# Patient Record
Sex: Male | Born: 1940 | Race: Black or African American | Hispanic: No | Marital: Single | Smoking: Current every day smoker
Health system: Southern US, Community
[De-identification: ages and names within clinical notes are randomized; demographics above are authoritative.]

---

## 1999-11-08 ENCOUNTER — Inpatient Hospital Stay (HOSPITAL_COMMUNITY): Admission: EM | Admit: 1999-11-08 | Discharge: 1999-11-11 | Payer: Self-pay | Admitting: Neurosurgery

## 1999-11-27 ENCOUNTER — Encounter: Payer: Self-pay | Admitting: Neurosurgery

## 1999-11-27 ENCOUNTER — Encounter: Admission: RE | Admit: 1999-11-27 | Discharge: 1999-11-27 | Payer: Self-pay | Admitting: Neurosurgery

## 1999-12-02 ENCOUNTER — Inpatient Hospital Stay (HOSPITAL_COMMUNITY): Admission: RE | Admit: 1999-12-02 | Discharge: 1999-12-04 | Payer: Self-pay | Admitting: Neurosurgery

## 1999-12-02 ENCOUNTER — Encounter: Payer: Self-pay | Admitting: Neurosurgery

## 2002-07-07 ENCOUNTER — Encounter: Payer: Self-pay | Admitting: General Surgery

## 2002-07-07 ENCOUNTER — Ambulatory Visit (HOSPITAL_COMMUNITY): Admission: RE | Admit: 2002-07-07 | Discharge: 2002-07-07 | Payer: Self-pay | Admitting: General Surgery

## 2002-07-11 ENCOUNTER — Encounter (HOSPITAL_COMMUNITY): Admission: RE | Admit: 2002-07-11 | Discharge: 2002-08-10 | Payer: Self-pay | Admitting: General Surgery

## 2002-07-11 ENCOUNTER — Encounter: Payer: Self-pay | Admitting: General Surgery

## 2008-06-27 ENCOUNTER — Ambulatory Visit: Payer: Self-pay | Admitting: Internal Medicine

## 2008-07-18 ENCOUNTER — Encounter: Payer: Self-pay | Admitting: Internal Medicine

## 2008-07-18 ENCOUNTER — Ambulatory Visit (HOSPITAL_COMMUNITY): Admission: RE | Admit: 2008-07-18 | Discharge: 2008-07-18 | Payer: Self-pay | Admitting: Internal Medicine

## 2008-07-18 ENCOUNTER — Ambulatory Visit: Payer: Self-pay | Admitting: Internal Medicine

## 2008-08-15 ENCOUNTER — Encounter (INDEPENDENT_AMBULATORY_CARE_PROVIDER_SITE_OTHER): Payer: Self-pay | Admitting: *Deleted

## 2008-08-15 DIAGNOSIS — Z8719 Personal history of other diseases of the digestive system: Secondary | ICD-10-CM | POA: Insufficient documentation

## 2008-08-15 DIAGNOSIS — A630 Anogenital (venereal) warts: Secondary | ICD-10-CM

## 2010-10-14 NOTE — Consult Note (Signed)
Hayden Thompson, Hayden Thompson             ACCOUNT NO.:  [id number]   MEDICAL RECORD NO.:  [id number]         PATIENT TYPE:  AMB   LOCATION:  DAY                           FACILITY:  [facility]   PHYSICIAN:  R. Garfield Cornea, M.D. DATE OF BIRTH:  08-03-1940   DATE OF CONSULTATION:  06/27/2008  DATE OF DISCHARGE:                                 CONSULTATION   REFERRING PHYSICIAN:  Tesfaye D. Legrand Rams, MD   REASON FOR CONSULTATION:  History of Crohn's disease, need for screening  colonoscopy.   HISTORY OF PRESENT ILLNESS:  Hayden Thompson is a very pleasant 59-  year-old [place] American gentleman seen at the request of Dr. Legrand Rams for  consideration of colorectal cancer screening.  He has a history of what  sounds like small bowel Crohn's disease and underwent 2 laparotomies  back in 1975.  At that time, he describes postprandial nausea and  vomiting.  Dr. Loanne Drilling, a surgeon here, performed a laparotomy and,  according to the patient, did not know what to do with it and closed  him back up, and sent him down to [facility] where he had another laparotomy  and has what sounds like a partial small bowel resection.  Since that  time, he has had relief of those symptoms, has 2 to 3 bowel movements  daily, has not really had any abdominal pain, nausea or vomiting and  apparently has thrived very well.  Will add further  evaluation/intervention he is on no Crohn's medications.  He denies ever  having any rectal bleeding or melena.  He denies odynophagia, dysphagia,  early satiety, reflux symptoms or nausea or vomiting currently.  His  weight has been stable.  He has never had his lower GI or rectal  evaluated via colonoscopy.  There is no family history of polyps or  colorectal neoplasia.   PAST MEDICAL HISTORY:  1. 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  2. Hypertension.  3. History of cerebral aneurysm status post clipping.   PAST SURGERIES:  1. Laparotomy x2 for Crohn's in 1975.  2. Craniotomy for  aneurysm clipping.   CURRENT MEDICATIONS:  1. Lisinopril/HCTZ 2.20/12.5 mg daily.  2. Amlodipine 10 mg daily.  3. Aspirin p.r.n.   ALLERGIES:  No known drug allergies.   FAMILY HISTORY:  Negative for chronic GI or liver illness.   SOCIAL HISTORY:  Patient is single.  He has 2 children.  He is retired  from [employer].  He smokes 1 pack of cigarettes per day.  He rarely consumes alcoholic beverage.   REVIEW OF SYSTEMS:  No chest pain, no dyspnea on exertion, no change in  weight, no fever, chills, no night sweats.  Otherwise, as in history of  present illness.   PHYSICAL EXAMINATION:  Pleasant 70 year old gentleman resting  comfortably.  Weight 144, height 5 foot 7, temp 98.4, BP 130/88, pulse  60.  SKIN:  Warm and dry.  HEENT:  No scleral icterus.  Conjunctivae are pink.  CHEST:  Lungs are clear to auscultation.  CORONARY:  Regular rate and rhythm without murmur, gallop, or rub.  ABDOMEN:  Nondistended.  He  has a  midline vertical laparotomy scars,  some cheloid formation.  Positive bowel sounds.  Soft, entirely  nontender, without appreciable mass or hepatomegaly.  EXTREMITIES:  No edema.  RECTAL:  Deferred to the time of colonoscopy.   IMPRESSION:  Hayden Thompson is a very pleasant 70 year old  gentleman, virtually asymptomatic from a gastrointestinal standpoint.  He gives a history of a laparotomy for Crohn's disease back in 1975.  It  appears he does have Crohn's disease.  He has enjoyed a long-term  surgical remission for some 35 years.  He has never had his lower  gastrointestinal tract evaluated.  I agree with Dr. Legrand Rams the most  pressing issue now is a screening colonoscopy.  Risks, benefits,  alternatives and limitations of the procedure have been reviewed with  Hayden Thompson.  Hopefully, I will be able to get up into the terminal ileum  and assess him for Crohn's disease activity.  His questions were  answered.  He is agreeable.  We will plan to  perform a colonoscopy in  the very near future.  Further recommendations to follow.   Thank you, Dr. Legrand Rams, for allowing me to see this very nice gentleman  today.      Bridgette Habermann, M.D.  Electronically Signed     RMR/MEDQ  D:  06/27/2008  T:  06/27/2008  Job:  [id number]   cc:   Tesfaye D. Legrand Rams, MD  Fax: [phone]

## 2010-10-14 NOTE — Op Note (Signed)
NAMESHARON, BENTE             ACCOUNT NO.:  [id number]   MEDICAL RECORD NO.:  [id number]          PATIENT TYPE:  AMB   LOCATION:  DAY                           FACILITY:  [facility]   PHYSICIAN:  R. Garfield Cornea, M.D. DATE OF BIRTH:  1941-03-01   DATE OF PROCEDURE:  07/18/2008  DATE OF DISCHARGE:                               OPERATIVE REPORT   Ileal colonoscopy with biopsy.   INDICATIONS FOR PROCEDURE:  A 70 year old gentleman with no GI symptoms  currently gives a history of laparotomy x2 for Crohn disease back in the  mid 1970s.  He again, not having any GI symptoms currently, not on any  medications for Crohn's.  He has never had his lower GI tract evaluated.  There is no family history of colorectal neoplasia.  Colonoscopy is now  being done as a standard screening maneuver.  The risks, benefits,  alternatives and limitations have been reviewed and questions answered.  Please see the documentation in the medical record.   PROCEDURE NOTE:  O2 saturation, blood pressure, pulse, respirations were  monitored throughout the entire procedure.   CONSCIOUS SEDATION:  Versed 3 mg IV and Demerol 50 mg IV in divided  doses.   INSTRUMENT:  Pentax video chip system.   FINDINGS:  Digital rectal exam was performed.  Endoscopic findings of  the prep was marginal to poor.  Colon:  The colonic mucosa was surveyed  from rectosigmoid junction through the left transverse and right colon  to the appendiceal orifice, ileocecal valve and cecum.  These structures  were well seen and photographed for the record.  He had an intact  ileocecal valve and cecum and appendiceal orifice.  The terminal ileum  was intubated to 5 cm and from this level the scope was slowly  withdrawn.  All previous mentioned mucosal surfaces were again seen.  The prep was poor.  There was tenacious coating of stool throughout the  colon with relative sparing of the area of the cecum and ileocecal  valve.  I could not wash  all the stool away to get adequate  visualization of all the finer mucosal detail of the colon.  There were  no gross colonic mucosal abnormalities.  Please note that his terminal  ileum also appeared entirely normal.  The scope was pulled down in the  rectum where again the prep was poor.  Examination of the rectal mucosa  including retroflexion was carried out.  At the anal verge was a 1-1.5  cm hard, fibrotic mass with a polypoid surface.  Please see multiple  photographs.  This lesion was hard to biopsy palpation.  It was biopsied  multiple times with minimal bleeding.  The patient tolerated the  procedure well and was reactive after endoscopy.   IMPRESSION:  A 1-1.5 cm polypoid hard, fibrotic mass at the anal verge  status post biopsy.  Rectal mucosa not well seen along with the  remainder of the colonic mucosa due to a poor preparation.  No gross  rectal or colonic lesions, otherwise.  Intact ileocecal valve,  appendiceal orifice and terminal ileum which in my mind  brings in the  question of the diagnosis of Crohn disease.  I suppose he could have  Crohn disease and in fact has enjoyed a remarkable remission.   As far as colon cancer screening goes, I would bring this gentleman back  early i.e., in 5 years given the relatively poor prep today.  He would  need and extended prep at this time.  However, depending on the  pathology on the nodule removed from his rectum plans may change.      Bridgette Habermann, M.D.  Electronically Signed     RMR/MEDQ  D:  07/18/2008  T:  07/18/2008  Job:  [id number]   cc:   Tesfaye D. Legrand Rams, MD  Fax: [phone]

## 2010-10-17 NOTE — Op Note (Signed)
[facility]  Patient:    Hayden Thompson, Hayden Thompson                      MRN: [id number] Proc. Date: 12/01/99 Adm. Date:  [id number] Attending:  Abran Duke                           Operative Report  PREOPERATIVE DIAGNOSIS:  Left subacute subdural hematoma.  POSTOPERATIVE DIAGNOSIS:  Left subacute subdural hematoma.  OPERATION:  Bur hole drainage of left subacute subdural hematoma.  SURGEON:  Earnie Larsson, M.D.  ASSISTANT:  None.  ANESTHESIA:  General endotracheal.  INDICATIONS FOR PROCEDURE:  [name] is a 70 year old black male with history of head trauma approximately four weeks ago.  Patient has progressively worsening headaches.  CT scanning demonstrates an enlarging subacute left convexity subdural hematoma.  There is significant mass affect. Patient presents now for bur hole drainage.  DESCRIPTION OF PROCEDURE:  Patient taken to the operating and placed on the table in the supine position after general anesthesia achieved.  Patient positioned supine with his neck turned to the right.  The patients frontotemporal parietal scalp was shaved and prepped sterilely on the left side.  A 10 blade was used to make a linear skin incision in the left frontal and left parietal region.  Self-retaining retractor was placed.  The high- speed drill was then used to make bur holes in the left frontal and left parietal region.  The dura was then coagulated and incised in the cruciate fashion.  Subdural fluid was allowed to drain under pressure.  The subdural compartment was washed out. A 10 mm flat Blake drain was then left in the subdural space.  It was passed from the posterior burr hole to the anterior burr hole under direct visualization.  This was then exited through a separate stab incision.  Hemostasis was excellent.  There was no evidence of active bleeding.  Wound is then closed in layers with Vicryl sutures.  Staples applied to the surface.   There were no intraoperative complications.  Patient tolerated procedure well and returns to the recovery room postoperatively. DD:  12/01/99 TD:  12/02/99 Job: [id number]

## 2010-10-17 NOTE — H&P (Signed)
[facility]  Patient:    Hayden Thompson, Hayden Thompson                      MRN: [id number] Adm. Date:  11/08/99 Attending:  Earnie Larsson, M.D.                         History and Physical  ADMISSION DIAGNOSES: 1. Bilateral subacute hematoma. 2. History of severe alcohol abuse. 3. History of cocaine abuse. 4. Remote history of chest pain.  HISTORY OF PRESENT ILLNESS:  Hayden Thompson is a 70 year old black male with history of alcohol abuse and cocaine abuse.  The patient states he has been trying to detoxify. He states he has been free of any alcohol or cocaine over the past four weeks.  The patient reports that two to three days ago, he suffered a fall secondary to some of the medications he has been taking.  He struck his chest on the refrigerator and was having intermittent chest pain. He had an episode of chest pain this evening and presented to the [facility] Room for evaluation of the chest pain. At that time, the patient also complained of headaches.  The patients chest pain resolved.  Cardiac workup at [facility] was negative.  Enzymes were negative.  EKG was negative for any acute changes and the patient was completely free of any further chest pain.  They did proceed with a head CT scan which demonstrated small bilateral subacute subdural hematomas.  The left is somewhat greater than the right. The left one is just under 9 mm in width.  There is minimal if any mass effects associated with this.  There is some acute blood mixed in with the chronic blood.  The patient has no other neurological complaints other than headache.  He has no motor or sensory complaints.  He has no cranial nerve dysfunction.  He says cognitively he has been doing well.  PAST MEDICAL HISTORY:  Notable for a remote history of Crohns disease.  The patient takes no medicines for this.  He has a history of alcohol abuse and drug abuse as noted above.  CURRENT MEDICATIONS:   Unavailable. The patient states he has been taking some nerve pills.  ALLERGIES:  None.  FAMILY HISTORY:  Noncontributory.  SOCIAL HISTORY:  The patient is employed by the [employer].  He is not married.  He smokes tobacco reasonably heavily.  REVIEW OF SYSTEMS:  Negative for any ongoing cardiac, respiratory, genitourinary, or musculoskeletal complaints.  PHYSICAL EXAMINATION:  NEUROLOGIC:  He is very pleasant and cooperative.  He is a medium built black male who is holding his head.  He is awake, alert, oriented and appropriate. His speech is fluent.  His judgment and insight are intact.  Visual acuity and visual fields are normal bilaterally.  Extraocular movements are full.  Facial movement and sensation are normal bilaterally. His hearing is intact bilaterally. His tongue protrudes in the midline bilaterally.  His shoulders shrug equally.  His palate elevates in the midline.  His tongue protrudes in the midline.  Motor and sensory examination of the extremities is completely normal.  He has no evidence of pronator drift.  Deep tendon reflexes are normoactive.  HEENT:  Reveals him to be atraumatic and normocephalic. There is no evidence of any bony abnormalities.  His oropharynx and nasopharynx and external auditory canals are all clear.  NECK:  Supple with a full  active range of motion.  CHEST: Clear to auscultation.  HEART:  Regular rate and rhythm.  ABDOMEN:  Benign.  EXTREMITIES:  Free from injury or deformity.  GU/RECTAL:  Deferred.  I reviewed this patients CT scan of his brain.  This does demonstrate small bilateral subacute subdural hematoma.  Left is somewhat larger than the right. Once again, there is some mild mass effects but no significant indications for proceeding with surgery.  IMPRESSION:  Bilateral subacute subdural hematoma with minimal mass effect.  PLAN:  Admission for observation.  I will treat him with IV steroids and hopefully  this in time will allow his symptoms to dissipate.  I do not think the fluid collections will be require drainage at this point. Should he worsen or even not improve than I would consider operative intervention. DD:  11/08/99 TD:  11/09/99 Job: [id number]

## 2013-10-27 ENCOUNTER — Other Ambulatory Visit (HOSPITAL_COMMUNITY): Payer: Self-pay | Admitting: Internal Medicine

## 2013-10-27 DIAGNOSIS — N19 Unspecified kidney failure: Secondary | ICD-10-CM

## 2013-11-01 ENCOUNTER — Ambulatory Visit (HOSPITAL_COMMUNITY)
Admission: RE | Admit: 2013-11-01 | Discharge: 2013-11-01 | Disposition: A | Discharge status: Home / Self Care | Payer: Medicare Other | Source: Ambulatory Visit | Attending: Internal Medicine | Admitting: Internal Medicine

## 2013-11-01 DIAGNOSIS — N19 Unspecified kidney failure: Secondary | ICD-10-CM | POA: Insufficient documentation

## 2013-11-01 DIAGNOSIS — Q619 Cystic kidney disease, unspecified: Secondary | ICD-10-CM | POA: Insufficient documentation

## 2014-07-12 DIAGNOSIS — K5090 Crohn's disease, unspecified, without complications: Secondary | ICD-10-CM | POA: Diagnosis not present

## 2014-07-12 DIAGNOSIS — I10 Essential (primary) hypertension: Secondary | ICD-10-CM | POA: Diagnosis not present

## 2014-07-12 DIAGNOSIS — F17290 Nicotine dependence, other tobacco product, uncomplicated: Secondary | ICD-10-CM | POA: Diagnosis not present

## 2014-09-06 IMAGING — US US RENAL
1 series · 14 of 25 positions shown · non-contrast
Comparison: None.

CLINICAL DATA: Renal failure

EXAM:
RENAL/URINARY TRACT ULTRASOUND COMPLETE

[Series 1: us renal · 0.18mm/px · 14 of 67 slices shown]
[im 1/67]
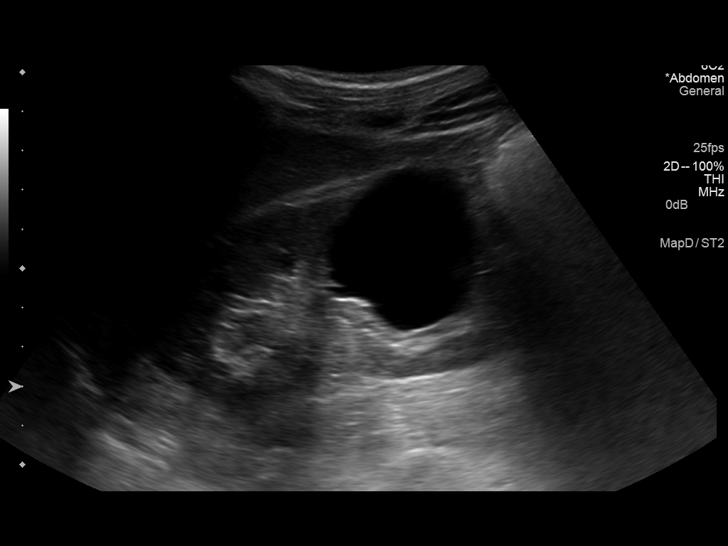
[im 6/67]
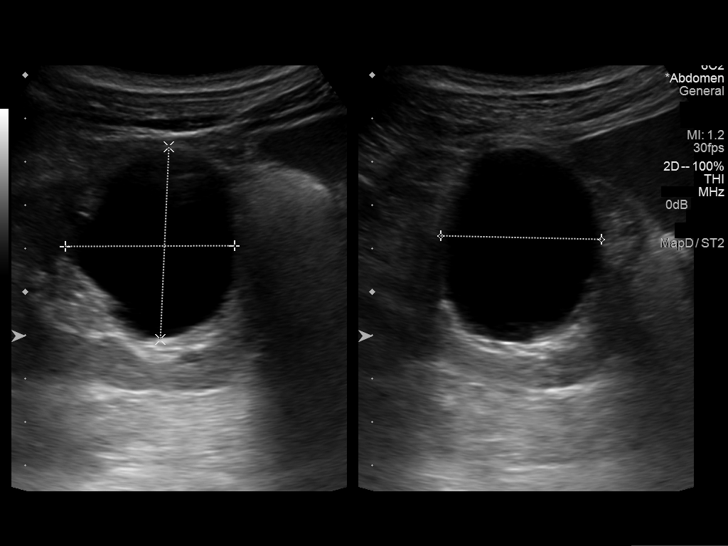
[im 12/67]
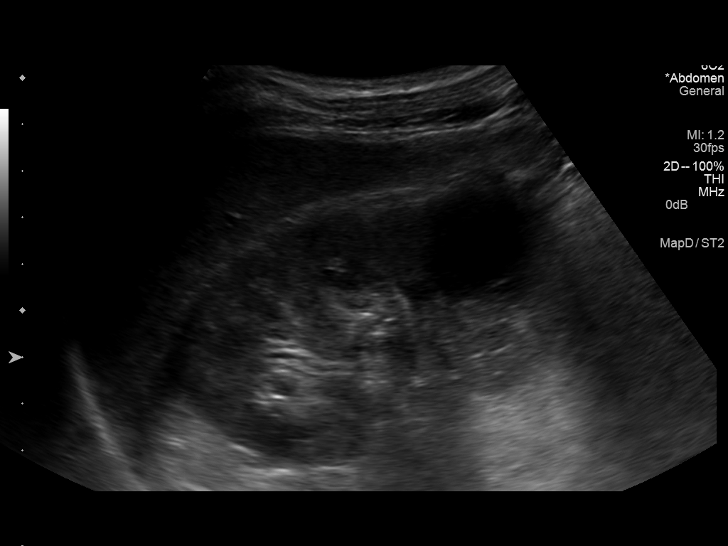
[im 17/67]
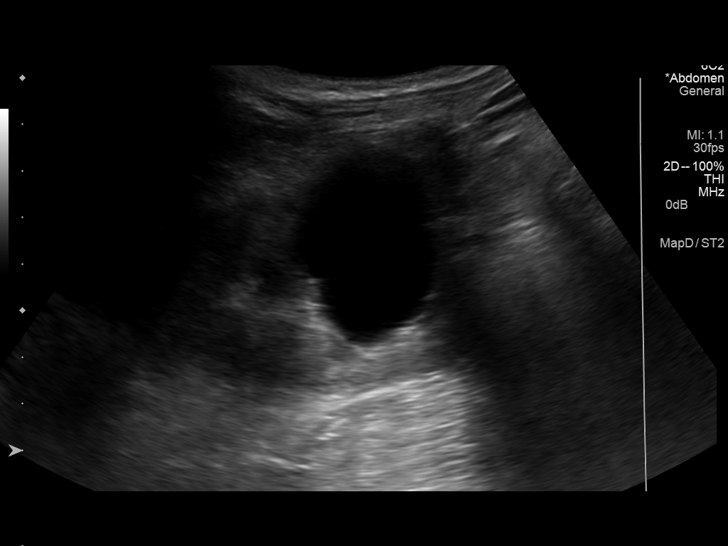
[im 23/67]
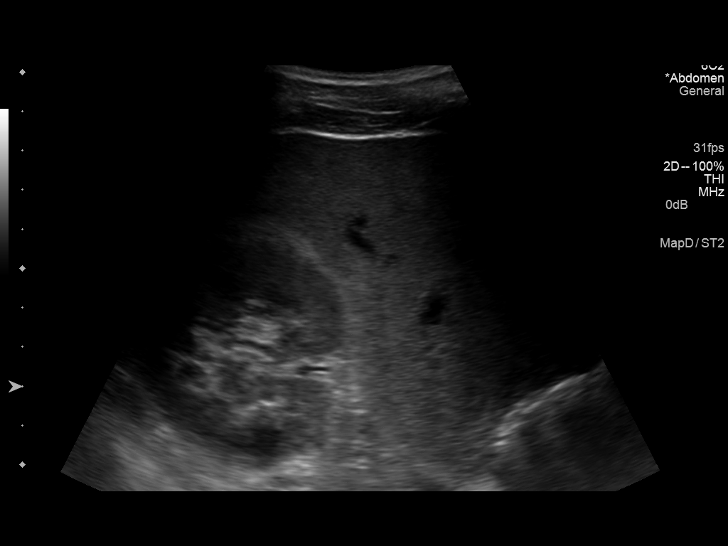
[im 25/67]
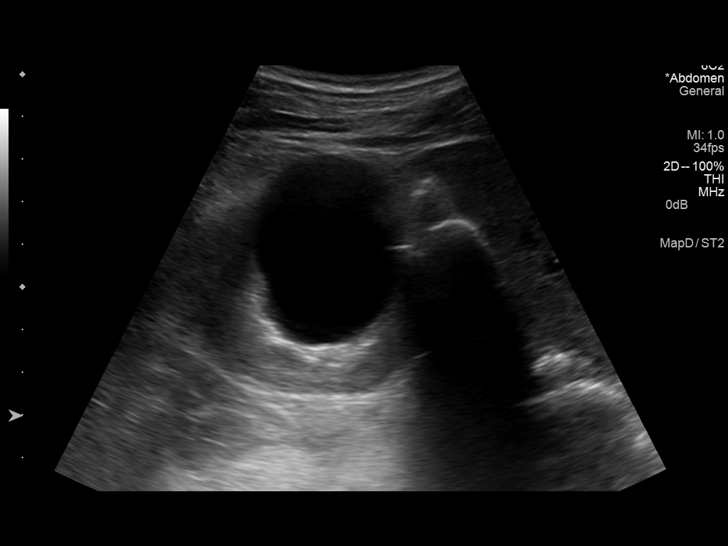
[im 31/67]
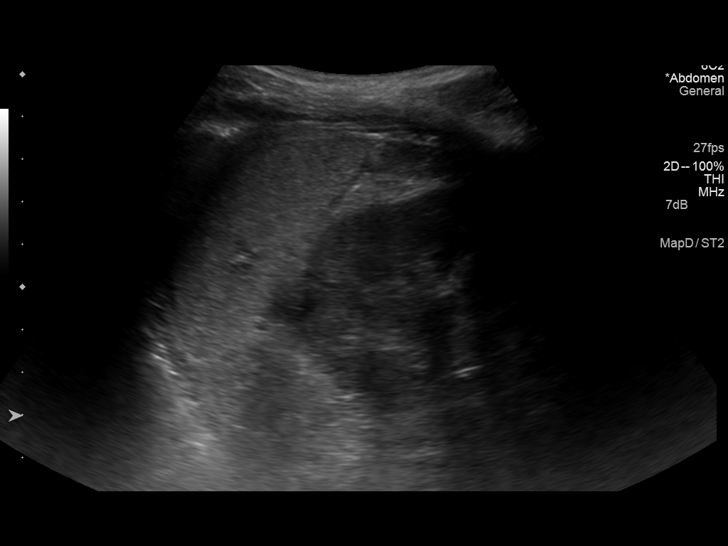
[im 36/67]
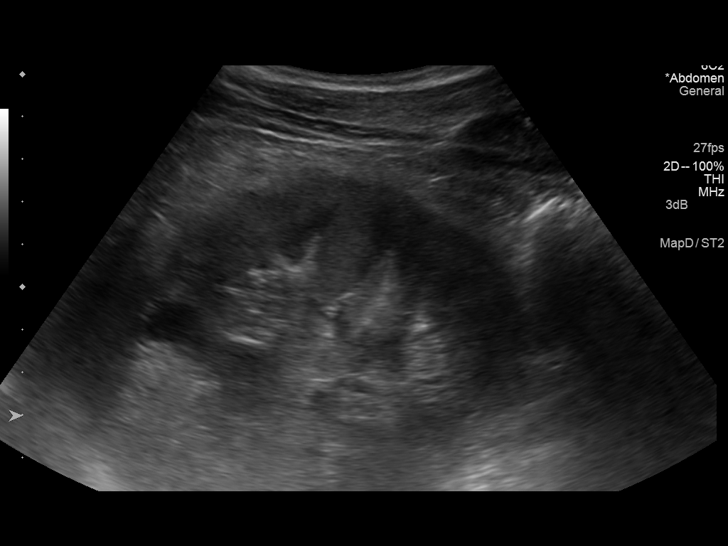
[im 42/67]
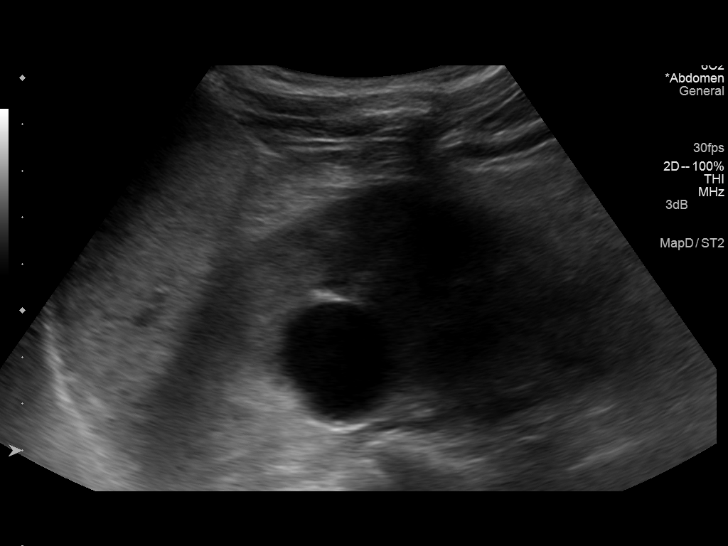
[im 45/67]
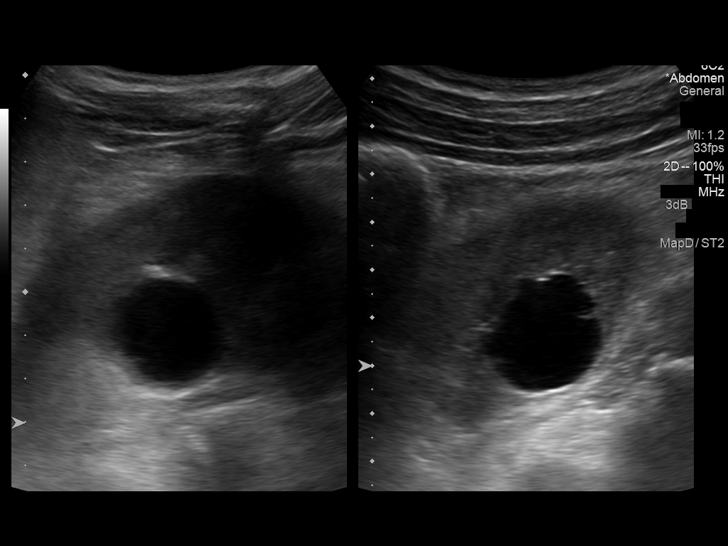
[im 50/67]
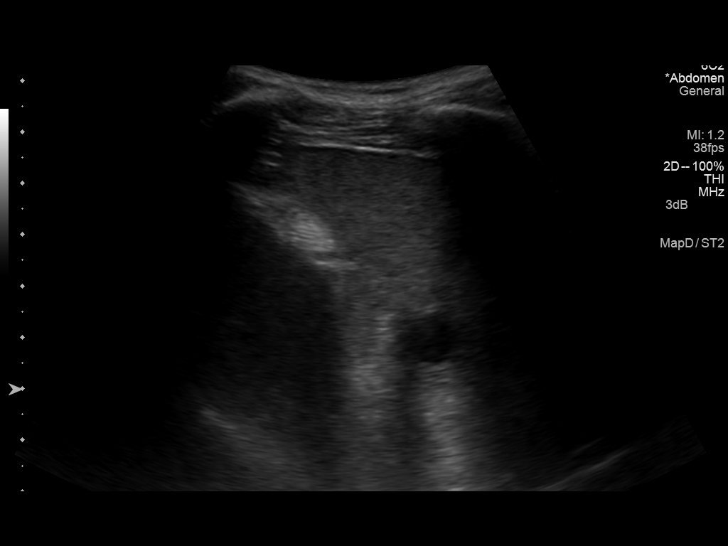
[im 56/67]
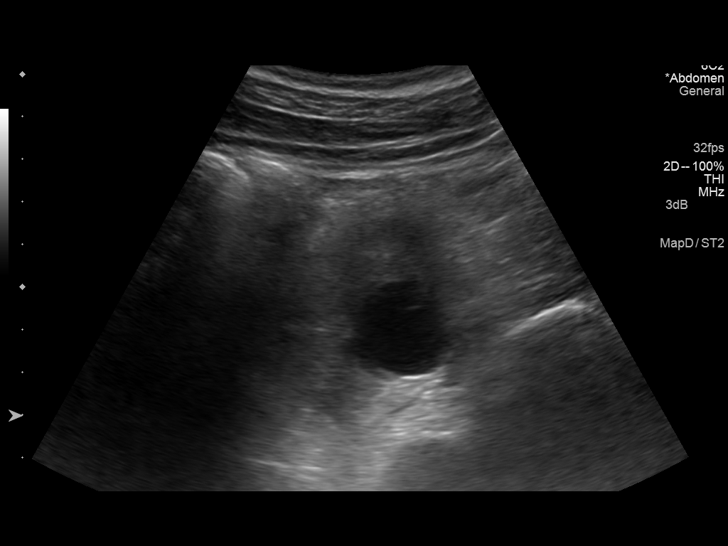
[im 61/67]
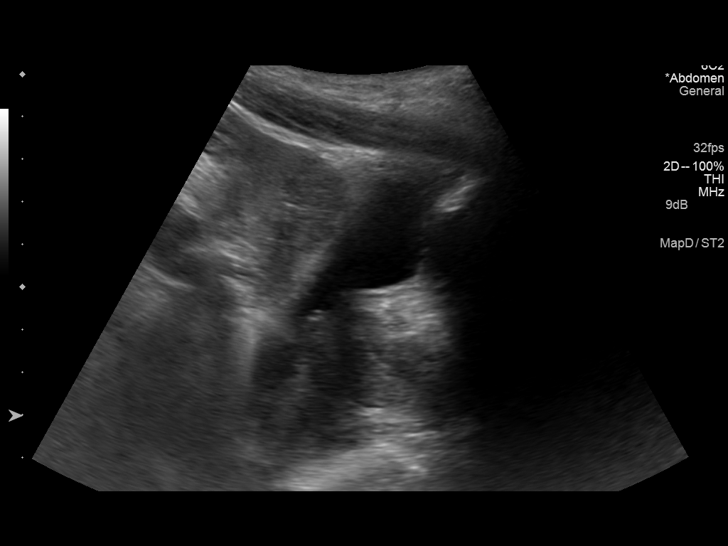
[im 67/67]
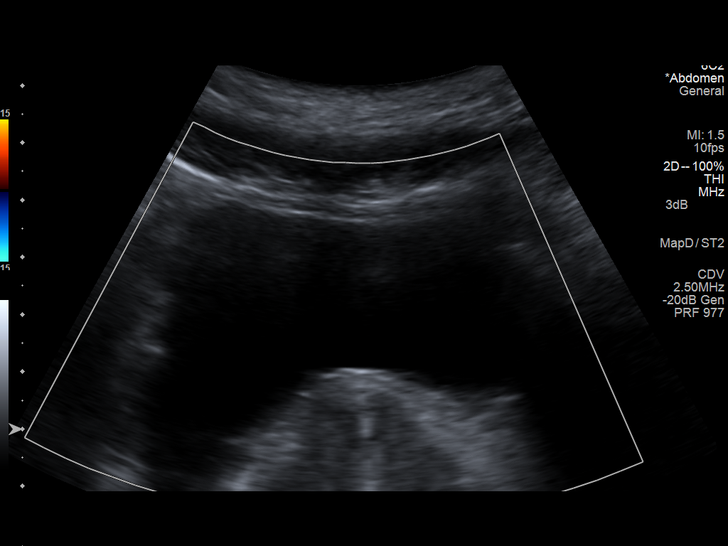

[14 of 25 positions shown; findings below may reference images not displayed]

FINDINGS: Right Kidney:

Length: 10.2 cm. There are multiple anechoic cysts within the right
kidney. The largest measures 3.9 cm. The cyst have no blood flow or
internal nodularity.

Left Kidney:

Length: 10.0 cm.. Multiple anechoic renal cortical cysts. The
largest measures 2.5 cm with no complexity.

Bladder:

Appears normal for degree of bladder distention.
IMPRESSION: 1. Multiple bilateral benign renal cysts.
2. No hydronephrosis.

## 2014-09-10 DIAGNOSIS — F17290 Nicotine dependence, other tobacco product, uncomplicated: Secondary | ICD-10-CM | POA: Diagnosis not present

## 2014-09-10 DIAGNOSIS — K5090 Crohn's disease, unspecified, without complications: Secondary | ICD-10-CM | POA: Diagnosis not present

## 2014-09-10 DIAGNOSIS — I10 Essential (primary) hypertension: Secondary | ICD-10-CM | POA: Diagnosis not present

## 2014-12-10 ENCOUNTER — Other Ambulatory Visit (HOSPITAL_COMMUNITY): Payer: Self-pay | Admitting: Respiratory Therapy

## 2014-12-10 DIAGNOSIS — K50919 Crohn's disease, unspecified, with unspecified complications: Secondary | ICD-10-CM

## 2014-12-10 DIAGNOSIS — R7309 Other abnormal glucose: Secondary | ICD-10-CM | POA: Diagnosis not present

## 2014-12-10 DIAGNOSIS — I10 Essential (primary) hypertension: Secondary | ICD-10-CM | POA: Diagnosis not present

## 2014-12-10 DIAGNOSIS — F17200 Nicotine dependence, unspecified, uncomplicated: Secondary | ICD-10-CM | POA: Diagnosis not present

## 2014-12-10 DIAGNOSIS — R739 Hyperglycemia, unspecified: Secondary | ICD-10-CM | POA: Diagnosis not present

## 2014-12-10 DIAGNOSIS — K5090 Crohn's disease, unspecified, without complications: Secondary | ICD-10-CM | POA: Diagnosis not present

## 2014-12-10 DIAGNOSIS — J42 Unspecified chronic bronchitis: Secondary | ICD-10-CM | POA: Diagnosis not present

## 2014-12-10 DIAGNOSIS — Z0000 Encounter for general adult medical examination without abnormal findings: Secondary | ICD-10-CM | POA: Diagnosis not present

## 2014-12-18 ENCOUNTER — Ambulatory Visit (HOSPITAL_COMMUNITY)
Admission: RE | Admit: 2014-12-18 | Discharge: 2014-12-18 | Disposition: A | Discharge status: Home / Self Care | Payer: Medicare Other | Source: Ambulatory Visit | Attending: Internal Medicine | Admitting: Internal Medicine

## 2014-12-18 ENCOUNTER — Other Ambulatory Visit (HOSPITAL_COMMUNITY): Payer: Self-pay | Admitting: Internal Medicine

## 2014-12-18 DIAGNOSIS — J40 Bronchitis, not specified as acute or chronic: Secondary | ICD-10-CM

## 2014-12-18 DIAGNOSIS — J449 Chronic obstructive pulmonary disease, unspecified: Secondary | ICD-10-CM | POA: Diagnosis not present

## 2015-03-11 DIAGNOSIS — F17200 Nicotine dependence, unspecified, uncomplicated: Secondary | ICD-10-CM | POA: Diagnosis not present

## 2015-03-11 DIAGNOSIS — K5090 Crohn's disease, unspecified, without complications: Secondary | ICD-10-CM | POA: Diagnosis not present

## 2015-03-11 DIAGNOSIS — N183 Chronic kidney disease, stage 3 (moderate): Secondary | ICD-10-CM | POA: Diagnosis not present

## 2015-03-11 DIAGNOSIS — I10 Essential (primary) hypertension: Secondary | ICD-10-CM | POA: Diagnosis not present

## 2015-03-20 DIAGNOSIS — I10 Essential (primary) hypertension: Secondary | ICD-10-CM | POA: Diagnosis not present

## 2015-03-20 DIAGNOSIS — N183 Chronic kidney disease, stage 3 (moderate): Secondary | ICD-10-CM | POA: Diagnosis not present

## 2015-03-29 ENCOUNTER — Other Ambulatory Visit (HOSPITAL_COMMUNITY): Payer: Self-pay | Admitting: Nephrology

## 2015-03-29 DIAGNOSIS — N183 Chronic kidney disease, stage 3 unspecified: Secondary | ICD-10-CM

## 2015-04-17 ENCOUNTER — Ambulatory Visit (HOSPITAL_COMMUNITY)
Admission: RE | Admit: 2015-04-17 | Discharge: 2015-04-17 | Disposition: A | Discharge status: Home / Self Care | Payer: Medicare Other | Source: Ambulatory Visit | Attending: Nephrology | Admitting: Nephrology

## 2015-04-17 DIAGNOSIS — N281 Cyst of kidney, acquired: Secondary | ICD-10-CM | POA: Diagnosis not present

## 2015-04-17 DIAGNOSIS — I129 Hypertensive chronic kidney disease with stage 1 through stage 4 chronic kidney disease, or unspecified chronic kidney disease: Secondary | ICD-10-CM | POA: Insufficient documentation

## 2015-04-17 DIAGNOSIS — Z79899 Other long term (current) drug therapy: Secondary | ICD-10-CM | POA: Diagnosis not present

## 2015-04-17 DIAGNOSIS — I10 Essential (primary) hypertension: Secondary | ICD-10-CM | POA: Diagnosis not present

## 2015-04-17 DIAGNOSIS — N183 Chronic kidney disease, stage 3 unspecified: Secondary | ICD-10-CM

## 2015-04-17 DIAGNOSIS — R809 Proteinuria, unspecified: Secondary | ICD-10-CM | POA: Diagnosis not present

## 2015-04-17 DIAGNOSIS — D509 Iron deficiency anemia, unspecified: Secondary | ICD-10-CM | POA: Diagnosis not present

## 2015-05-08 DIAGNOSIS — E538 Deficiency of other specified B group vitamins: Secondary | ICD-10-CM | POA: Diagnosis not present

## 2015-05-08 DIAGNOSIS — E559 Vitamin D deficiency, unspecified: Secondary | ICD-10-CM | POA: Diagnosis not present

## 2015-05-08 DIAGNOSIS — N183 Chronic kidney disease, stage 3 (moderate): Secondary | ICD-10-CM | POA: Diagnosis not present

## 2015-06-17 DIAGNOSIS — K5090 Crohn's disease, unspecified, without complications: Secondary | ICD-10-CM | POA: Diagnosis not present

## 2015-06-17 DIAGNOSIS — I10 Essential (primary) hypertension: Secondary | ICD-10-CM | POA: Diagnosis not present

## 2015-06-17 DIAGNOSIS — E538 Deficiency of other specified B group vitamins: Secondary | ICD-10-CM | POA: Diagnosis not present

## 2015-06-17 DIAGNOSIS — N182 Chronic kidney disease, stage 2 (mild): Secondary | ICD-10-CM | POA: Diagnosis not present

## 2015-06-19 DIAGNOSIS — D510 Vitamin B12 deficiency anemia due to intrinsic factor deficiency: Secondary | ICD-10-CM | POA: Diagnosis not present

## 2015-06-19 DIAGNOSIS — N182 Chronic kidney disease, stage 2 (mild): Secondary | ICD-10-CM | POA: Diagnosis not present

## 2015-06-19 DIAGNOSIS — I10 Essential (primary) hypertension: Secondary | ICD-10-CM | POA: Diagnosis not present

## 2015-06-19 DIAGNOSIS — E538 Deficiency of other specified B group vitamins: Secondary | ICD-10-CM | POA: Diagnosis not present

## 2015-06-19 DIAGNOSIS — K5090 Crohn's disease, unspecified, without complications: Secondary | ICD-10-CM | POA: Diagnosis not present

## 2015-07-17 DIAGNOSIS — E538 Deficiency of other specified B group vitamins: Secondary | ICD-10-CM | POA: Diagnosis not present

## 2015-08-15 DIAGNOSIS — E559 Vitamin D deficiency, unspecified: Secondary | ICD-10-CM | POA: Diagnosis not present

## 2015-08-15 DIAGNOSIS — D509 Iron deficiency anemia, unspecified: Secondary | ICD-10-CM | POA: Diagnosis not present

## 2015-08-15 DIAGNOSIS — N182 Chronic kidney disease, stage 2 (mild): Secondary | ICD-10-CM | POA: Diagnosis not present

## 2015-08-15 DIAGNOSIS — N183 Chronic kidney disease, stage 3 (moderate): Secondary | ICD-10-CM | POA: Diagnosis not present

## 2015-08-15 DIAGNOSIS — R7309 Other abnormal glucose: Secondary | ICD-10-CM | POA: Diagnosis not present

## 2015-08-15 DIAGNOSIS — K5090 Crohn's disease, unspecified, without complications: Secondary | ICD-10-CM | POA: Diagnosis not present

## 2015-08-15 DIAGNOSIS — I10 Essential (primary) hypertension: Secondary | ICD-10-CM | POA: Diagnosis not present

## 2015-08-15 DIAGNOSIS — E538 Deficiency of other specified B group vitamins: Secondary | ICD-10-CM | POA: Diagnosis not present

## 2015-08-19 DIAGNOSIS — N183 Chronic kidney disease, stage 3 (moderate): Secondary | ICD-10-CM | POA: Diagnosis not present

## 2015-08-19 DIAGNOSIS — E559 Vitamin D deficiency, unspecified: Secondary | ICD-10-CM | POA: Diagnosis not present

## 2015-08-19 DIAGNOSIS — D509 Iron deficiency anemia, unspecified: Secondary | ICD-10-CM | POA: Diagnosis not present

## 2015-08-19 DIAGNOSIS — I10 Essential (primary) hypertension: Secondary | ICD-10-CM | POA: Diagnosis not present

## 2015-08-19 DIAGNOSIS — R7309 Other abnormal glucose: Secondary | ICD-10-CM | POA: Diagnosis not present

## 2015-08-21 DIAGNOSIS — N183 Chronic kidney disease, stage 3 (moderate): Secondary | ICD-10-CM | POA: Diagnosis not present

## 2015-08-21 DIAGNOSIS — I10 Essential (primary) hypertension: Secondary | ICD-10-CM | POA: Diagnosis not present

## 2015-09-09 DIAGNOSIS — K5090 Crohn's disease, unspecified, without complications: Secondary | ICD-10-CM | POA: Diagnosis not present

## 2015-09-09 DIAGNOSIS — I10 Essential (primary) hypertension: Secondary | ICD-10-CM | POA: Diagnosis not present

## 2015-09-09 DIAGNOSIS — N183 Chronic kidney disease, stage 3 (moderate): Secondary | ICD-10-CM | POA: Diagnosis not present

## 2015-09-09 DIAGNOSIS — Z23 Encounter for immunization: Secondary | ICD-10-CM | POA: Diagnosis not present

## 2015-12-09 DIAGNOSIS — N184 Chronic kidney disease, stage 4 (severe): Secondary | ICD-10-CM | POA: Diagnosis not present

## 2015-12-09 DIAGNOSIS — I10 Essential (primary) hypertension: Secondary | ICD-10-CM | POA: Diagnosis not present

## 2016-03-09 DIAGNOSIS — K5090 Crohn's disease, unspecified, without complications: Secondary | ICD-10-CM | POA: Diagnosis not present

## 2016-03-09 DIAGNOSIS — I10 Essential (primary) hypertension: Secondary | ICD-10-CM | POA: Diagnosis not present

## 2016-03-09 DIAGNOSIS — N184 Chronic kidney disease, stage 4 (severe): Secondary | ICD-10-CM | POA: Diagnosis not present

## 2016-03-25 DIAGNOSIS — I10 Essential (primary) hypertension: Secondary | ICD-10-CM | POA: Diagnosis not present

## 2016-03-25 DIAGNOSIS — Z79899 Other long term (current) drug therapy: Secondary | ICD-10-CM | POA: Diagnosis not present

## 2016-03-25 DIAGNOSIS — R809 Proteinuria, unspecified: Secondary | ICD-10-CM | POA: Diagnosis not present

## 2016-03-25 DIAGNOSIS — E559 Vitamin D deficiency, unspecified: Secondary | ICD-10-CM | POA: Diagnosis not present

## 2016-03-25 DIAGNOSIS — D509 Iron deficiency anemia, unspecified: Secondary | ICD-10-CM | POA: Diagnosis not present

## 2016-03-31 DIAGNOSIS — N183 Chronic kidney disease, stage 3 (moderate): Secondary | ICD-10-CM | POA: Diagnosis not present

## 2016-03-31 DIAGNOSIS — N250 Renal osteodystrophy: Secondary | ICD-10-CM | POA: Diagnosis not present

## 2016-03-31 DIAGNOSIS — I10 Essential (primary) hypertension: Secondary | ICD-10-CM | POA: Diagnosis not present

## 2016-06-08 DIAGNOSIS — I10 Essential (primary) hypertension: Secondary | ICD-10-CM | POA: Diagnosis not present

## 2016-06-08 DIAGNOSIS — N184 Chronic kidney disease, stage 4 (severe): Secondary | ICD-10-CM | POA: Diagnosis not present

## 2016-06-08 DIAGNOSIS — K5090 Crohn's disease, unspecified, without complications: Secondary | ICD-10-CM | POA: Diagnosis not present

## 2016-08-24 IMAGING — US US RENAL
1 series · 14 of 25 positions shown · non-contrast
Comparison: Kidney ultrasound dated November 01, 2013

CLINICAL DATA: Chronic renal insufficiency stage III, hypertension.

EXAM:
RENAL / URINARY TRACT ULTRASOUND COMPLETE

[Series 1: us renal · 0.21mm/px · 14 of 49 slices shown]
[im 1/49]
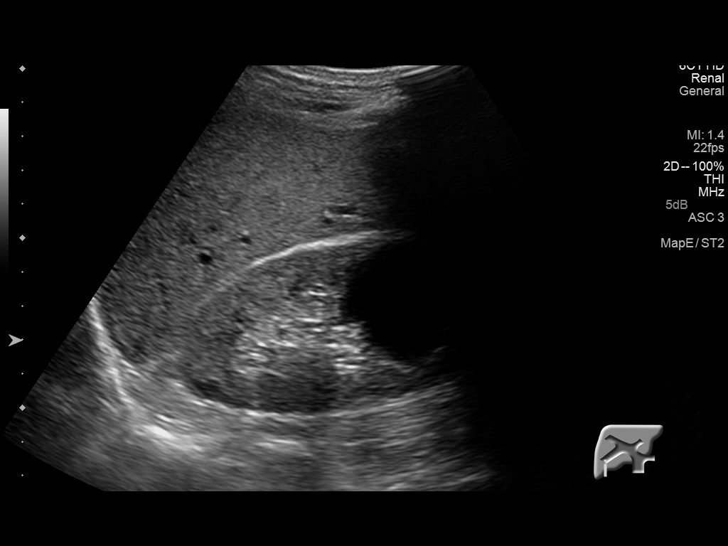
[im 5/49]
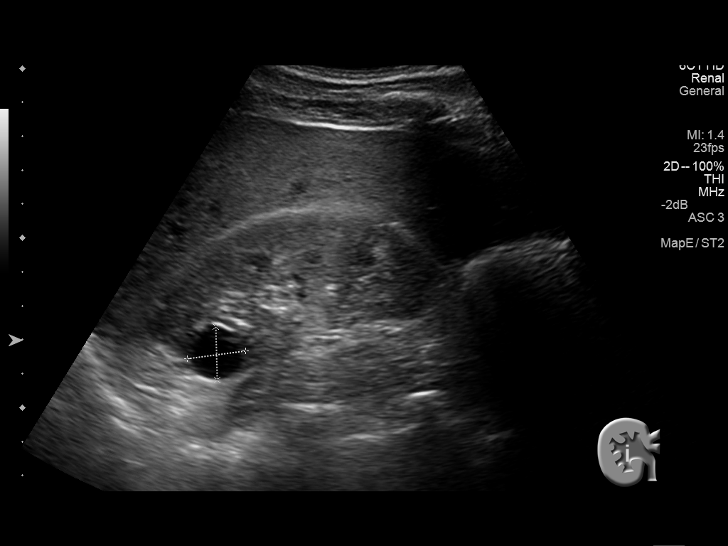
[im 9/49]
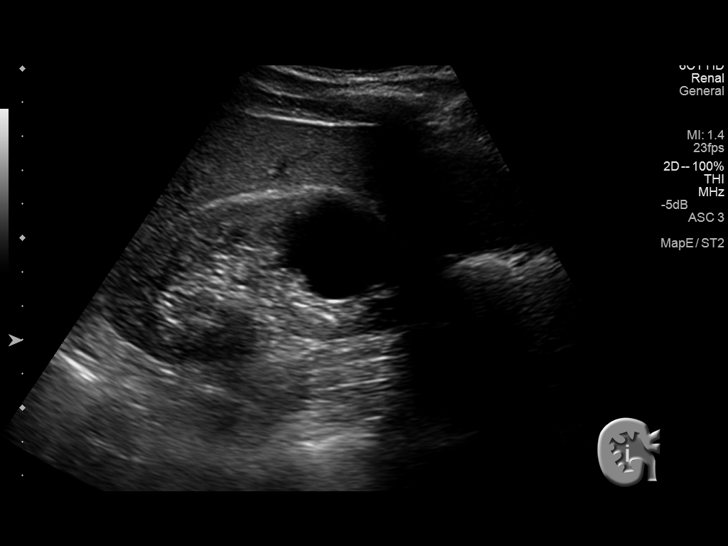
[im 13/49]
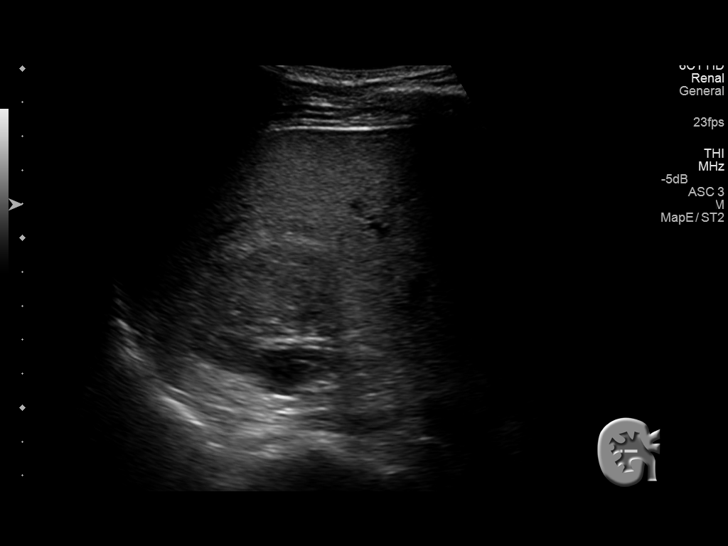
[im 17/49]
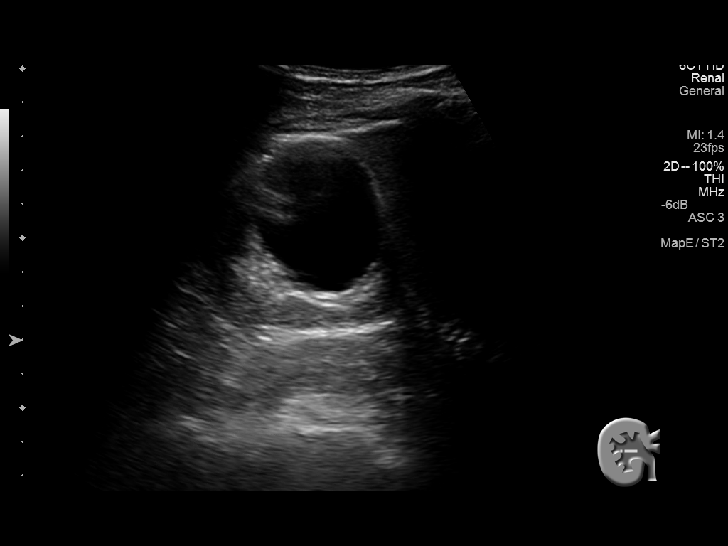
[im 19/49]
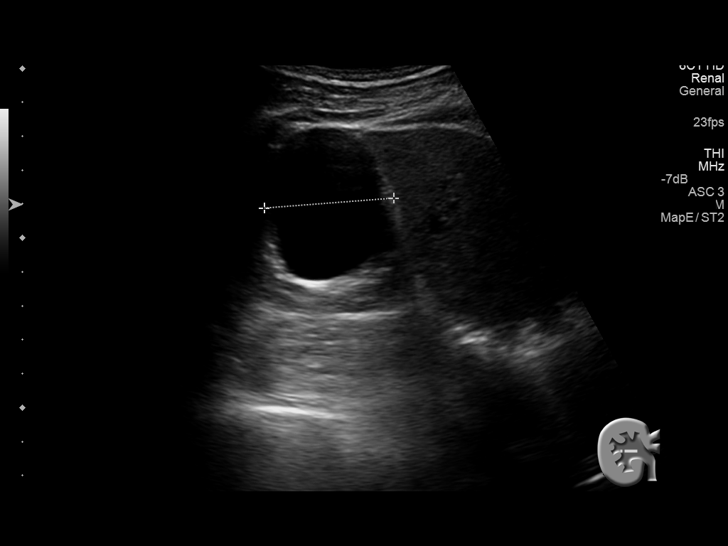
[im 23/49]
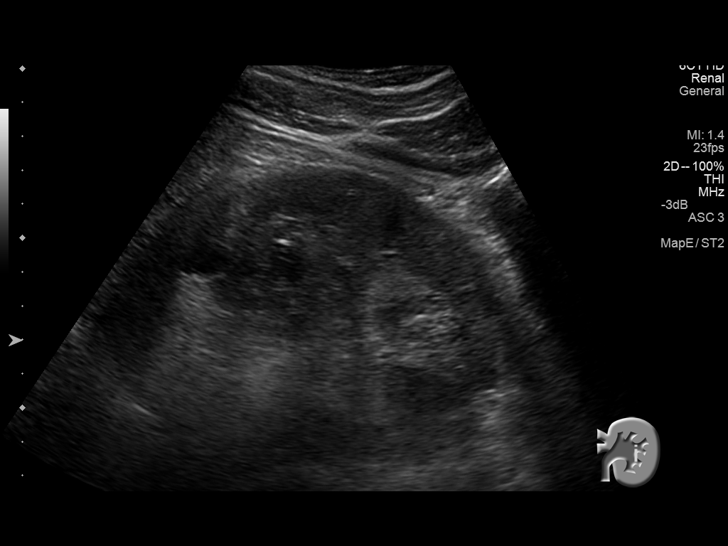
[im 27/49]
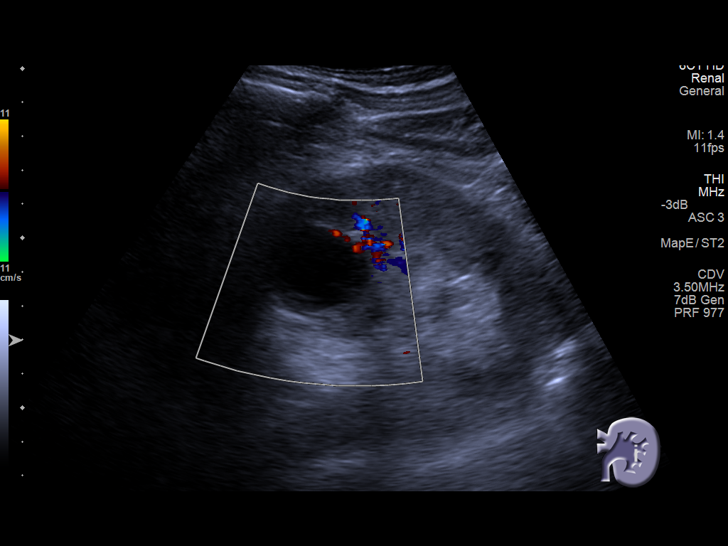
[im 31/49]
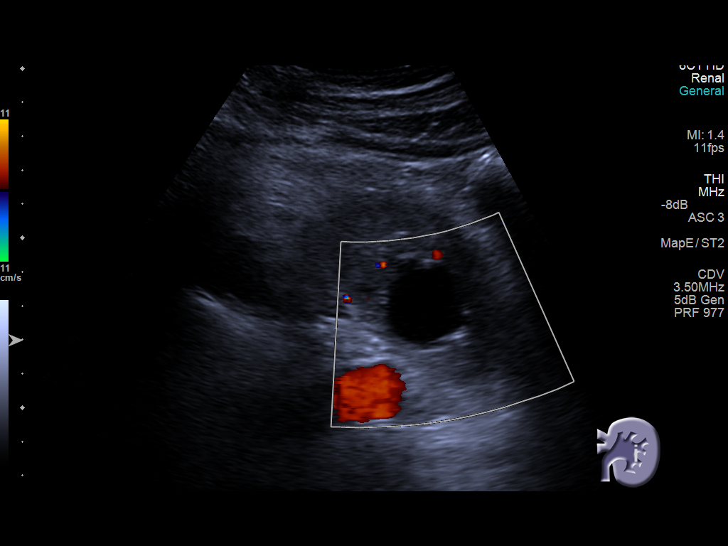
[im 33/49]
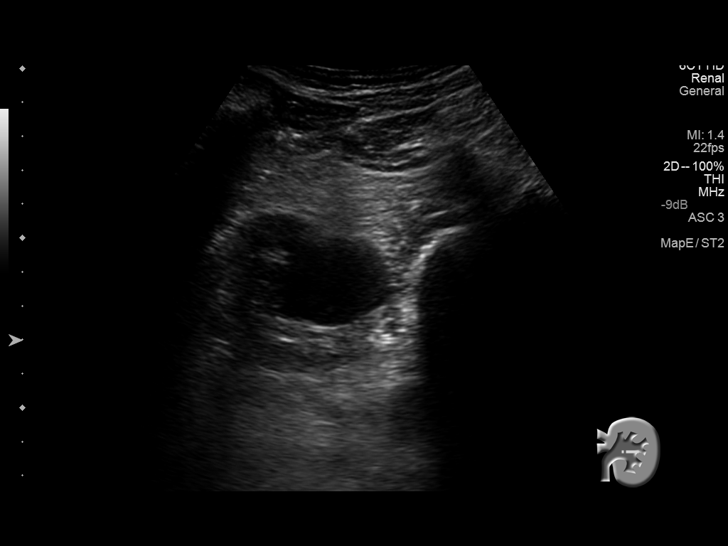
[im 37/49]
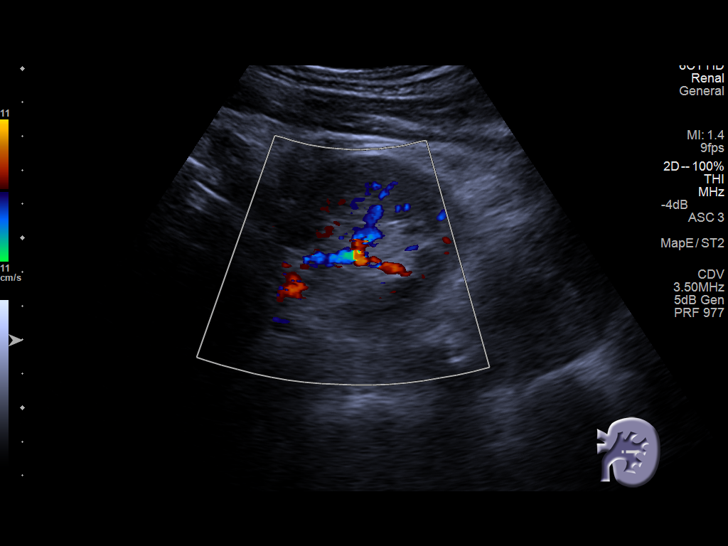
[im 41/49]
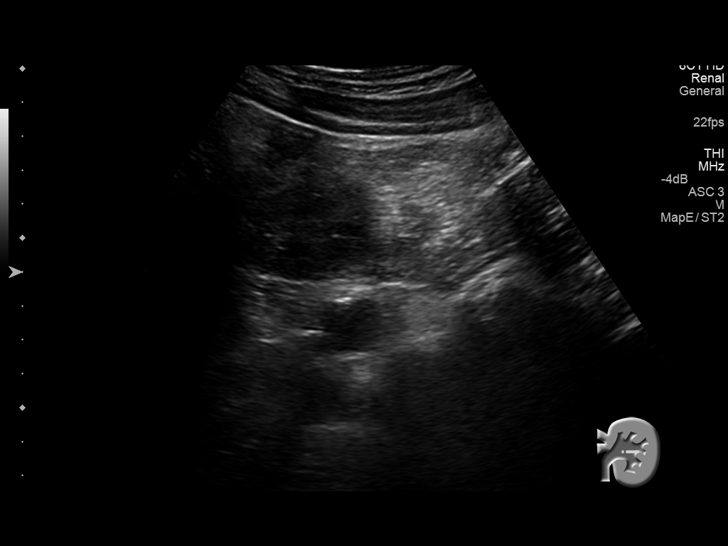
[im 45/49]
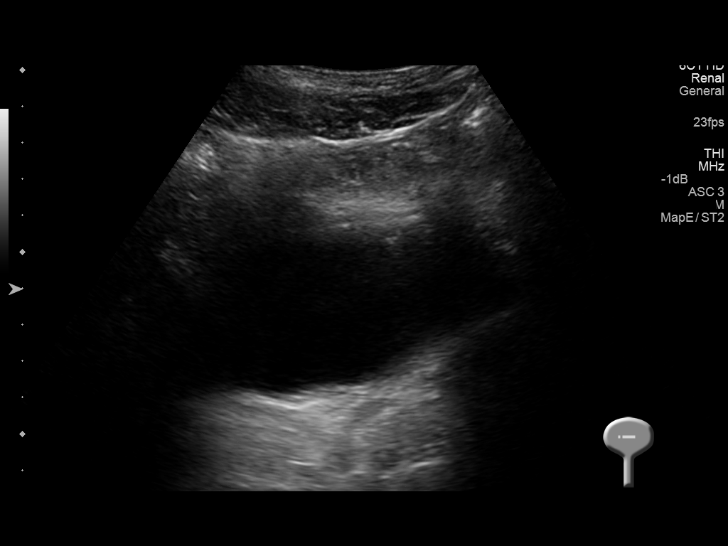
[im 49/49]
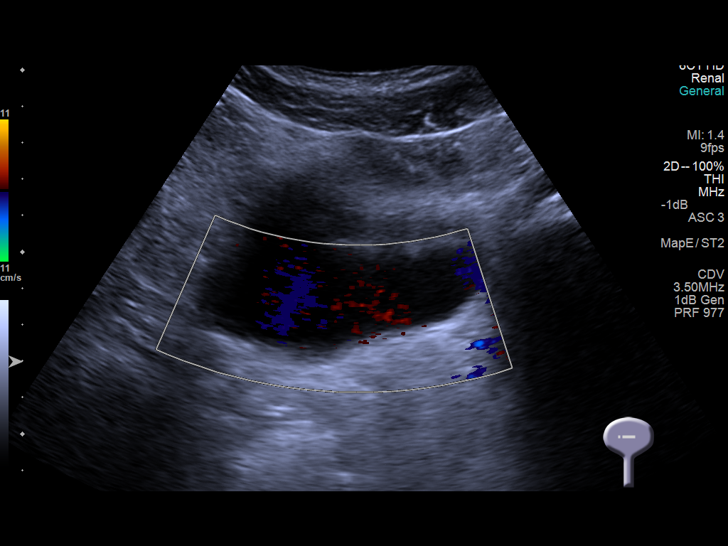

[14 of 25 positions shown; findings below may reference images not displayed]

FINDINGS: Right Kidney:

Length: 10.1 cm. The renal cortical echotexture is mildly increased
but remains lower than that of the adjacent liver. There are
multiple cysts. In the upper pole the largest measures 1.7 x 1.6 x 2
cm. In the lower pole the largest cyst measures 4.6 x 4.5 x 3.8 cm.
There is no hydronephrosis.

Left Kidney:

Length: 10.9 cm. The renal cortical echotexture is mildly increased
but is similar in appearance to that of the right kidney. There is
no hydronephrosis. Multiple cysts are demonstrated. In the upper
pole there is a 3.3 x 2.7 x 3.4 cm cyst. In the mid to lower pole
there is a 2.8 x 2.8 x 2.7 cm cyst.

Bladder:

The urinary bladder is normal in appearance with bilateral jets
observed.
IMPRESSION: 1. Stable multiple simple appearing renal cysts. There is no
hydronephrosis.
2. Mildly increased renal cortical echotexture still lower than the
adjacent liver is consistent with medical renal disease.
3. There is no hydronephrosis. The urinary bladder is grossly
normal.

## 2016-09-09 DIAGNOSIS — N183 Chronic kidney disease, stage 3 (moderate): Secondary | ICD-10-CM | POA: Diagnosis not present

## 2016-09-09 DIAGNOSIS — D509 Iron deficiency anemia, unspecified: Secondary | ICD-10-CM | POA: Diagnosis not present

## 2016-09-09 DIAGNOSIS — I10 Essential (primary) hypertension: Secondary | ICD-10-CM | POA: Diagnosis not present

## 2016-09-09 DIAGNOSIS — E559 Vitamin D deficiency, unspecified: Secondary | ICD-10-CM | POA: Diagnosis not present

## 2016-09-09 DIAGNOSIS — N184 Chronic kidney disease, stage 4 (severe): Secondary | ICD-10-CM | POA: Diagnosis not present

## 2016-09-09 DIAGNOSIS — Z1389 Encounter for screening for other disorder: Secondary | ICD-10-CM | POA: Diagnosis not present

## 2016-09-09 DIAGNOSIS — K5090 Crohn's disease, unspecified, without complications: Secondary | ICD-10-CM | POA: Diagnosis not present

## 2016-12-15 DIAGNOSIS — I10 Essential (primary) hypertension: Secondary | ICD-10-CM | POA: Diagnosis not present

## 2016-12-15 DIAGNOSIS — K5090 Crohn's disease, unspecified, without complications: Secondary | ICD-10-CM | POA: Diagnosis not present

## 2016-12-15 DIAGNOSIS — N184 Chronic kidney disease, stage 4 (severe): Secondary | ICD-10-CM | POA: Diagnosis not present

## 2017-03-17 DIAGNOSIS — I10 Essential (primary) hypertension: Secondary | ICD-10-CM | POA: Diagnosis not present

## 2017-03-17 DIAGNOSIS — Z23 Encounter for immunization: Secondary | ICD-10-CM | POA: Diagnosis not present

## 2017-03-17 DIAGNOSIS — D509 Iron deficiency anemia, unspecified: Secondary | ICD-10-CM | POA: Diagnosis not present

## 2017-03-17 DIAGNOSIS — K5090 Crohn's disease, unspecified, without complications: Secondary | ICD-10-CM | POA: Diagnosis not present

## 2017-03-17 DIAGNOSIS — N183 Chronic kidney disease, stage 3 (moderate): Secondary | ICD-10-CM | POA: Diagnosis not present

## 2017-03-17 DIAGNOSIS — N184 Chronic kidney disease, stage 4 (severe): Secondary | ICD-10-CM | POA: Diagnosis not present

## 2017-03-17 DIAGNOSIS — E559 Vitamin D deficiency, unspecified: Secondary | ICD-10-CM | POA: Diagnosis not present

## 2017-06-18 DIAGNOSIS — K5090 Crohn's disease, unspecified, without complications: Secondary | ICD-10-CM | POA: Diagnosis not present

## 2017-06-18 DIAGNOSIS — E538 Deficiency of other specified B group vitamins: Secondary | ICD-10-CM | POA: Diagnosis not present

## 2017-06-18 DIAGNOSIS — N184 Chronic kidney disease, stage 4 (severe): Secondary | ICD-10-CM | POA: Diagnosis not present

## 2017-09-17 DIAGNOSIS — I10 Essential (primary) hypertension: Secondary | ICD-10-CM | POA: Diagnosis not present

## 2017-09-17 DIAGNOSIS — K5090 Crohn's disease, unspecified, without complications: Secondary | ICD-10-CM | POA: Diagnosis not present

## 2017-09-17 DIAGNOSIS — N184 Chronic kidney disease, stage 4 (severe): Secondary | ICD-10-CM | POA: Diagnosis not present

## 2017-09-17 DIAGNOSIS — Z1331 Encounter for screening for depression: Secondary | ICD-10-CM | POA: Diagnosis not present

## 2017-09-17 DIAGNOSIS — Z1389 Encounter for screening for other disorder: Secondary | ICD-10-CM | POA: Diagnosis not present

## 2017-09-17 DIAGNOSIS — R7309 Other abnormal glucose: Secondary | ICD-10-CM | POA: Diagnosis not present

## 2017-09-17 DIAGNOSIS — R739 Hyperglycemia, unspecified: Secondary | ICD-10-CM | POA: Diagnosis not present

## 2017-09-17 DIAGNOSIS — D509 Iron deficiency anemia, unspecified: Secondary | ICD-10-CM | POA: Diagnosis not present

## 2017-12-17 DIAGNOSIS — I10 Essential (primary) hypertension: Secondary | ICD-10-CM | POA: Diagnosis not present

## 2017-12-17 DIAGNOSIS — N184 Chronic kidney disease, stage 4 (severe): Secondary | ICD-10-CM | POA: Diagnosis not present

## 2018-03-24 DIAGNOSIS — N184 Chronic kidney disease, stage 4 (severe): Secondary | ICD-10-CM | POA: Diagnosis not present

## 2018-03-24 DIAGNOSIS — Z23 Encounter for immunization: Secondary | ICD-10-CM | POA: Diagnosis not present

## 2018-03-24 DIAGNOSIS — I10 Essential (primary) hypertension: Secondary | ICD-10-CM | POA: Diagnosis not present

## 2018-06-24 DIAGNOSIS — N184 Chronic kidney disease, stage 4 (severe): Secondary | ICD-10-CM | POA: Diagnosis not present

## 2018-06-24 DIAGNOSIS — I10 Essential (primary) hypertension: Secondary | ICD-10-CM | POA: Diagnosis not present

## 2018-09-16 DIAGNOSIS — I10 Essential (primary) hypertension: Secondary | ICD-10-CM | POA: Diagnosis not present

## 2018-09-16 DIAGNOSIS — K5090 Crohn's disease, unspecified, without complications: Secondary | ICD-10-CM | POA: Diagnosis not present

## 2018-09-16 DIAGNOSIS — N184 Chronic kidney disease, stage 4 (severe): Secondary | ICD-10-CM | POA: Diagnosis not present

## 2018-11-25 DIAGNOSIS — Z1389 Encounter for screening for other disorder: Secondary | ICD-10-CM | POA: Diagnosis not present

## 2018-11-25 DIAGNOSIS — Z0001 Encounter for general adult medical examination with abnormal findings: Secondary | ICD-10-CM | POA: Diagnosis not present

## 2018-11-25 DIAGNOSIS — N184 Chronic kidney disease, stage 4 (severe): Secondary | ICD-10-CM | POA: Diagnosis not present

## 2018-11-25 DIAGNOSIS — K5090 Crohn's disease, unspecified, without complications: Secondary | ICD-10-CM | POA: Diagnosis not present

## 2018-11-25 DIAGNOSIS — I10 Essential (primary) hypertension: Secondary | ICD-10-CM | POA: Diagnosis not present

## 2018-12-25 DIAGNOSIS — I10 Essential (primary) hypertension: Secondary | ICD-10-CM | POA: Diagnosis not present

## 2018-12-25 DIAGNOSIS — K5090 Crohn's disease, unspecified, without complications: Secondary | ICD-10-CM | POA: Diagnosis not present

## 2019-01-25 DIAGNOSIS — N184 Chronic kidney disease, stage 4 (severe): Secondary | ICD-10-CM | POA: Diagnosis not present

## 2019-01-25 DIAGNOSIS — I10 Essential (primary) hypertension: Secondary | ICD-10-CM | POA: Diagnosis not present

## 2019-02-25 DIAGNOSIS — I10 Essential (primary) hypertension: Secondary | ICD-10-CM | POA: Diagnosis not present

## 2019-02-25 DIAGNOSIS — K5090 Crohn's disease, unspecified, without complications: Secondary | ICD-10-CM | POA: Diagnosis not present

## 2019-03-08 DIAGNOSIS — Z23 Encounter for immunization: Secondary | ICD-10-CM | POA: Diagnosis not present

## 2019-03-27 DIAGNOSIS — N184 Chronic kidney disease, stage 4 (severe): Secondary | ICD-10-CM | POA: Diagnosis not present

## 2019-03-27 DIAGNOSIS — K5090 Crohn's disease, unspecified, without complications: Secondary | ICD-10-CM | POA: Diagnosis not present

## 2019-04-27 DIAGNOSIS — I10 Essential (primary) hypertension: Secondary | ICD-10-CM | POA: Diagnosis not present

## 2019-04-27 DIAGNOSIS — N184 Chronic kidney disease, stage 4 (severe): Secondary | ICD-10-CM | POA: Diagnosis not present

## 2019-05-17 DIAGNOSIS — I10 Essential (primary) hypertension: Secondary | ICD-10-CM | POA: Diagnosis not present

## 2019-05-17 DIAGNOSIS — K5090 Crohn's disease, unspecified, without complications: Secondary | ICD-10-CM | POA: Diagnosis not present

## 2019-05-17 DIAGNOSIS — N184 Chronic kidney disease, stage 4 (severe): Secondary | ICD-10-CM | POA: Diagnosis not present

## 2019-08-15 DIAGNOSIS — I10 Essential (primary) hypertension: Secondary | ICD-10-CM | POA: Diagnosis not present

## 2019-08-15 DIAGNOSIS — N184 Chronic kidney disease, stage 4 (severe): Secondary | ICD-10-CM | POA: Diagnosis not present

## 2019-09-05 DIAGNOSIS — Z0001 Encounter for general adult medical examination with abnormal findings: Secondary | ICD-10-CM | POA: Diagnosis not present

## 2019-09-05 DIAGNOSIS — N184 Chronic kidney disease, stage 4 (severe): Secondary | ICD-10-CM | POA: Diagnosis not present

## 2019-09-05 DIAGNOSIS — Z1389 Encounter for screening for other disorder: Secondary | ICD-10-CM | POA: Diagnosis not present

## 2019-09-05 DIAGNOSIS — F17210 Nicotine dependence, cigarettes, uncomplicated: Secondary | ICD-10-CM | POA: Diagnosis not present

## 2019-09-05 DIAGNOSIS — K5090 Crohn's disease, unspecified, without complications: Secondary | ICD-10-CM | POA: Diagnosis not present

## 2019-10-05 DIAGNOSIS — I10 Essential (primary) hypertension: Secondary | ICD-10-CM | POA: Diagnosis not present

## 2019-10-05 DIAGNOSIS — K5090 Crohn's disease, unspecified, without complications: Secondary | ICD-10-CM | POA: Diagnosis not present

## 2019-11-05 DIAGNOSIS — N184 Chronic kidney disease, stage 4 (severe): Secondary | ICD-10-CM | POA: Diagnosis not present

## 2019-11-05 DIAGNOSIS — I10 Essential (primary) hypertension: Secondary | ICD-10-CM | POA: Diagnosis not present

## 2019-12-05 DIAGNOSIS — I10 Essential (primary) hypertension: Secondary | ICD-10-CM | POA: Diagnosis not present

## 2019-12-05 DIAGNOSIS — K5090 Crohn's disease, unspecified, without complications: Secondary | ICD-10-CM | POA: Diagnosis not present

## 2020-01-05 DIAGNOSIS — K5090 Crohn's disease, unspecified, without complications: Secondary | ICD-10-CM | POA: Diagnosis not present

## 2020-01-05 DIAGNOSIS — I10 Essential (primary) hypertension: Secondary | ICD-10-CM | POA: Diagnosis not present

## 2020-02-14 DIAGNOSIS — F17210 Nicotine dependence, cigarettes, uncomplicated: Secondary | ICD-10-CM | POA: Diagnosis not present

## 2020-02-14 DIAGNOSIS — F17290 Nicotine dependence, other tobacco product, uncomplicated: Secondary | ICD-10-CM | POA: Diagnosis not present

## 2020-02-14 DIAGNOSIS — K5090 Crohn's disease, unspecified, without complications: Secondary | ICD-10-CM | POA: Diagnosis not present

## 2020-02-14 DIAGNOSIS — N184 Chronic kidney disease, stage 4 (severe): Secondary | ICD-10-CM | POA: Diagnosis not present

## 2020-02-14 DIAGNOSIS — Z23 Encounter for immunization: Secondary | ICD-10-CM | POA: Diagnosis not present

## 2020-02-14 DIAGNOSIS — I10 Essential (primary) hypertension: Secondary | ICD-10-CM | POA: Diagnosis not present

## 2020-03-15 DIAGNOSIS — I10 Essential (primary) hypertension: Secondary | ICD-10-CM | POA: Diagnosis not present

## 2020-03-15 DIAGNOSIS — N184 Chronic kidney disease, stage 4 (severe): Secondary | ICD-10-CM | POA: Diagnosis not present

## 2020-04-15 DIAGNOSIS — N184 Chronic kidney disease, stage 4 (severe): Secondary | ICD-10-CM | POA: Diagnosis not present

## 2020-04-15 DIAGNOSIS — I10 Essential (primary) hypertension: Secondary | ICD-10-CM | POA: Diagnosis not present

## 2020-04-18 ENCOUNTER — Ambulatory Visit: Payer: Medicare Other | Attending: Internal Medicine

## 2020-04-18 DIAGNOSIS — Z23 Encounter for immunization: Secondary | ICD-10-CM

## 2020-04-18 NOTE — Progress Notes (Signed)
   Covid-[facility] Vaccination [facility]  Name:  Hayden Thompson    MRN: [id number] DOB: [birth year]  04/18/2020  Mr. [name] was observed post Covid-19 immunization for 15 minutes without incident. He was provided with Vaccine Information Sheet and instruction to access the V-Safe system.   Mr. [name] was instructed to call 911 with any severe reactions post vaccine: Marland Kitchen Difficulty breathing  . Swelling of face and throat  . A fast heartbeat  . A bad rash all over body  . Dizziness and weakness   Immunizations Administered    No immunizations on file.

## 2020-05-15 DIAGNOSIS — I10 Essential (primary) hypertension: Secondary | ICD-10-CM | POA: Diagnosis not present

## 2020-05-15 DIAGNOSIS — N184 Chronic kidney disease, stage 4 (severe): Secondary | ICD-10-CM | POA: Diagnosis not present

## 2020-06-15 DIAGNOSIS — I10 Essential (primary) hypertension: Secondary | ICD-10-CM | POA: Diagnosis not present

## 2020-06-15 DIAGNOSIS — N184 Chronic kidney disease, stage 4 (severe): Secondary | ICD-10-CM | POA: Diagnosis not present

## 2020-07-30 DIAGNOSIS — F17290 Nicotine dependence, other tobacco product, uncomplicated: Secondary | ICD-10-CM | POA: Diagnosis not present

## 2020-07-30 DIAGNOSIS — I10 Essential (primary) hypertension: Secondary | ICD-10-CM | POA: Diagnosis not present

## 2020-08-20 DIAGNOSIS — N184 Chronic kidney disease, stage 4 (severe): Secondary | ICD-10-CM | POA: Diagnosis not present

## 2020-08-20 DIAGNOSIS — I10 Essential (primary) hypertension: Secondary | ICD-10-CM | POA: Diagnosis not present

## 2020-08-20 DIAGNOSIS — Z1389 Encounter for screening for other disorder: Secondary | ICD-10-CM | POA: Diagnosis not present

## 2020-08-20 DIAGNOSIS — F17210 Nicotine dependence, cigarettes, uncomplicated: Secondary | ICD-10-CM | POA: Diagnosis not present

## 2020-08-20 DIAGNOSIS — E559 Vitamin D deficiency, unspecified: Secondary | ICD-10-CM | POA: Diagnosis not present

## 2020-08-20 DIAGNOSIS — Z0001 Encounter for general adult medical examination with abnormal findings: Secondary | ICD-10-CM | POA: Diagnosis not present

## 2020-08-21 DIAGNOSIS — Z79899 Other long term (current) drug therapy: Secondary | ICD-10-CM | POA: Diagnosis not present

## 2020-08-21 DIAGNOSIS — Z0001 Encounter for general adult medical examination with abnormal findings: Secondary | ICD-10-CM | POA: Diagnosis not present

## 2020-08-21 DIAGNOSIS — I10 Essential (primary) hypertension: Secondary | ICD-10-CM | POA: Diagnosis not present

## 2020-09-20 DIAGNOSIS — I10 Essential (primary) hypertension: Secondary | ICD-10-CM | POA: Diagnosis not present

## 2020-09-20 DIAGNOSIS — N184 Chronic kidney disease, stage 4 (severe): Secondary | ICD-10-CM | POA: Diagnosis not present

## 2020-10-20 DIAGNOSIS — F17290 Nicotine dependence, other tobacco product, uncomplicated: Secondary | ICD-10-CM | POA: Diagnosis not present

## 2020-10-20 DIAGNOSIS — I10 Essential (primary) hypertension: Secondary | ICD-10-CM | POA: Diagnosis not present

## 2020-11-20 DIAGNOSIS — I10 Essential (primary) hypertension: Secondary | ICD-10-CM | POA: Diagnosis not present

## 2020-11-20 DIAGNOSIS — N184 Chronic kidney disease, stage 4 (severe): Secondary | ICD-10-CM | POA: Diagnosis not present

## 2020-12-20 DIAGNOSIS — I10 Essential (primary) hypertension: Secondary | ICD-10-CM | POA: Diagnosis not present

## 2020-12-20 DIAGNOSIS — K5090 Crohn's disease, unspecified, without complications: Secondary | ICD-10-CM | POA: Diagnosis not present

## 2021-01-20 DIAGNOSIS — K5090 Crohn's disease, unspecified, without complications: Secondary | ICD-10-CM | POA: Diagnosis not present

## 2021-01-20 DIAGNOSIS — I10 Essential (primary) hypertension: Secondary | ICD-10-CM | POA: Diagnosis not present

## 2021-02-17 DIAGNOSIS — K5090 Crohn's disease, unspecified, without complications: Secondary | ICD-10-CM | POA: Diagnosis not present

## 2021-02-17 DIAGNOSIS — F17290 Nicotine dependence, other tobacco product, uncomplicated: Secondary | ICD-10-CM | POA: Diagnosis not present

## 2021-02-17 DIAGNOSIS — N184 Chronic kidney disease, stage 4 (severe): Secondary | ICD-10-CM | POA: Diagnosis not present

## 2021-02-17 DIAGNOSIS — I10 Essential (primary) hypertension: Secondary | ICD-10-CM | POA: Diagnosis not present

## 2021-02-17 DIAGNOSIS — F17210 Nicotine dependence, cigarettes, uncomplicated: Secondary | ICD-10-CM | POA: Diagnosis not present

## 2021-02-17 DIAGNOSIS — Z23 Encounter for immunization: Secondary | ICD-10-CM | POA: Diagnosis not present

## 2021-03-19 DIAGNOSIS — N184 Chronic kidney disease, stage 4 (severe): Secondary | ICD-10-CM | POA: Diagnosis not present

## 2021-03-19 DIAGNOSIS — I10 Essential (primary) hypertension: Secondary | ICD-10-CM | POA: Diagnosis not present

## 2021-04-21 DIAGNOSIS — N184 Chronic kidney disease, stage 4 (severe): Secondary | ICD-10-CM | POA: Diagnosis not present

## 2021-04-21 DIAGNOSIS — I10 Essential (primary) hypertension: Secondary | ICD-10-CM | POA: Diagnosis not present

## 2021-05-21 DIAGNOSIS — I10 Essential (primary) hypertension: Secondary | ICD-10-CM | POA: Diagnosis not present

## 2021-05-21 DIAGNOSIS — N184 Chronic kidney disease, stage 4 (severe): Secondary | ICD-10-CM | POA: Diagnosis not present

## 2021-06-21 DIAGNOSIS — I10 Essential (primary) hypertension: Secondary | ICD-10-CM | POA: Diagnosis not present

## 2021-06-21 DIAGNOSIS — N184 Chronic kidney disease, stage 4 (severe): Secondary | ICD-10-CM | POA: Diagnosis not present

## 2021-07-22 DIAGNOSIS — N184 Chronic kidney disease, stage 4 (severe): Secondary | ICD-10-CM | POA: Diagnosis not present

## 2021-07-22 DIAGNOSIS — I10 Essential (primary) hypertension: Secondary | ICD-10-CM | POA: Diagnosis not present

## 2021-08-01 DIAGNOSIS — Z0001 Encounter for general adult medical examination with abnormal findings: Secondary | ICD-10-CM | POA: Diagnosis not present

## 2021-08-01 DIAGNOSIS — Z23 Encounter for immunization: Secondary | ICD-10-CM | POA: Diagnosis not present

## 2021-08-01 DIAGNOSIS — I10 Essential (primary) hypertension: Secondary | ICD-10-CM | POA: Diagnosis not present

## 2021-08-01 DIAGNOSIS — N184 Chronic kidney disease, stage 4 (severe): Secondary | ICD-10-CM | POA: Diagnosis not present

## 2021-08-01 DIAGNOSIS — F17210 Nicotine dependence, cigarettes, uncomplicated: Secondary | ICD-10-CM | POA: Diagnosis not present

## 2021-08-01 DIAGNOSIS — K5090 Crohn's disease, unspecified, without complications: Secondary | ICD-10-CM | POA: Diagnosis not present

## 2021-08-01 DIAGNOSIS — Z1389 Encounter for screening for other disorder: Secondary | ICD-10-CM | POA: Diagnosis not present

## 2021-09-01 DIAGNOSIS — N184 Chronic kidney disease, stage 4 (severe): Secondary | ICD-10-CM | POA: Diagnosis not present

## 2021-09-01 DIAGNOSIS — I10 Essential (primary) hypertension: Secondary | ICD-10-CM | POA: Diagnosis not present

## 2021-10-08 DIAGNOSIS — I10 Essential (primary) hypertension: Secondary | ICD-10-CM | POA: Diagnosis not present

## 2021-10-08 DIAGNOSIS — N184 Chronic kidney disease, stage 4 (severe): Secondary | ICD-10-CM | POA: Diagnosis not present

## 2021-11-08 DIAGNOSIS — I10 Essential (primary) hypertension: Secondary | ICD-10-CM | POA: Diagnosis not present

## 2021-11-08 DIAGNOSIS — K5090 Crohn's disease, unspecified, without complications: Secondary | ICD-10-CM | POA: Diagnosis not present

## 2021-12-08 DIAGNOSIS — I10 Essential (primary) hypertension: Secondary | ICD-10-CM | POA: Diagnosis not present

## 2021-12-08 DIAGNOSIS — N184 Chronic kidney disease, stage 4 (severe): Secondary | ICD-10-CM | POA: Diagnosis not present

## 2022-01-08 DIAGNOSIS — I10 Essential (primary) hypertension: Secondary | ICD-10-CM | POA: Diagnosis not present

## 2022-01-08 DIAGNOSIS — N184 Chronic kidney disease, stage 4 (severe): Secondary | ICD-10-CM | POA: Diagnosis not present

## 2022-01-30 DIAGNOSIS — Z23 Encounter for immunization: Secondary | ICD-10-CM | POA: Diagnosis not present

## 2022-01-30 DIAGNOSIS — K5090 Crohn's disease, unspecified, without complications: Secondary | ICD-10-CM | POA: Diagnosis not present

## 2022-01-30 DIAGNOSIS — N184 Chronic kidney disease, stage 4 (severe): Secondary | ICD-10-CM | POA: Diagnosis not present

## 2022-01-30 DIAGNOSIS — F17210 Nicotine dependence, cigarettes, uncomplicated: Secondary | ICD-10-CM | POA: Diagnosis not present

## 2022-01-30 DIAGNOSIS — F17290 Nicotine dependence, other tobacco product, uncomplicated: Secondary | ICD-10-CM | POA: Diagnosis not present

## 2022-01-30 DIAGNOSIS — I10 Essential (primary) hypertension: Secondary | ICD-10-CM | POA: Diagnosis not present

## 2022-03-01 DIAGNOSIS — N184 Chronic kidney disease, stage 4 (severe): Secondary | ICD-10-CM | POA: Diagnosis not present

## 2022-03-01 DIAGNOSIS — I10 Essential (primary) hypertension: Secondary | ICD-10-CM | POA: Diagnosis not present

## 2022-04-01 DIAGNOSIS — N184 Chronic kidney disease, stage 4 (severe): Secondary | ICD-10-CM | POA: Diagnosis not present

## 2022-04-01 DIAGNOSIS — I10 Essential (primary) hypertension: Secondary | ICD-10-CM | POA: Diagnosis not present

## 2022-05-01 DIAGNOSIS — N184 Chronic kidney disease, stage 4 (severe): Secondary | ICD-10-CM | POA: Diagnosis not present

## 2022-05-01 DIAGNOSIS — I10 Essential (primary) hypertension: Secondary | ICD-10-CM | POA: Diagnosis not present

## 2022-06-01 DIAGNOSIS — N184 Chronic kidney disease, stage 4 (severe): Secondary | ICD-10-CM | POA: Diagnosis not present

## 2022-06-01 DIAGNOSIS — I10 Essential (primary) hypertension: Secondary | ICD-10-CM | POA: Diagnosis not present

## 2022-08-28 ENCOUNTER — Ambulatory Visit
Admission: EM | Admit: 2022-08-28 | Discharge: 2022-08-28 | Disposition: A | Discharge status: Home / Self Care | Payer: Medicare Other | Attending: Family Medicine | Admitting: Family Medicine

## 2022-08-28 DIAGNOSIS — N4889 Other specified disorders of penis: Secondary | ICD-10-CM | POA: Diagnosis present

## 2022-08-28 LAB — POCT URINALYSIS DIP (MANUAL ENTRY)
Bilirubin, UA: NEGATIVE
Blood, UA: NEGATIVE
Glucose, UA: NEGATIVE mg/dL
Ketones, POC UA: NEGATIVE mg/dL
Leukocytes, UA: NEGATIVE
Nitrite, UA: NEGATIVE
Protein Ur, POC: 100 mg/dL — AB
Spec Grav, UA: >=1.03 — AB (ref 1.010–1.025)
Urobilinogen, UA: 0.2 E.U./dL
pH, UA: 5.5 (ref 5.0–8.0)

## 2022-08-28 MED ORDER — MUPIROCIN 2 % EX OINT: 1.0000 | TOPICAL_OINTMENT | Freq: Two times a day (BID) | CUTANEOUS | 0 refills | Status: AC

## 2022-08-28 NOTE — Discharge Instructions (Signed)
Apply the mupirocin ointment twice daily to the entire area of swelling and irritation, apply warm compresses off-and-on additionally.  We should have your swab results back in the next few days and we will let you know if anything comes back positive.  Follow-up with your primary care provider soon as possible to recheck your symptoms and if significantly worsening at any time, particularly being unable to pass urine due to the swelling go to the emergency department.

## 2022-08-28 NOTE — ED Triage Notes (Signed)
Pt c/o of penile swelling at the head,  x1 week.   Pt denies injury.

## 2022-08-28 NOTE — ED Provider Notes (Signed)
[facility]    CSN: [id number] Arrival date & time: 08/28/22  1053      History   Chief Complaint Chief Complaint  Patient presents with   Urinary Tract Infection    HPI Hayden Thompson is a 82 y.o. male.   Patient presenting today with 1 to 2-week history of slowly worsening swelling, pain, peeling to the head of penis.  Denies rashes or lesions, penile discharge, dysuria, hematuria, difficulty passing urine, abdominal pain or pelvic pain, fevers.  So far not trying anything over-the-counter for symptoms.  When asked, does state that he has a new sexual partner.    History reviewed. No pertinent past medical history.  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 No pertinent surgical history.     Home Medications    Prior to Admission medications   Medication Sig Start Date End Date Taking? Authorizing Provider  mupirocin ointment (BACTROBAN) 2 % Apply 1 Application topically 2 (two) times daily. 08/28/22  Yes Volney American, PA-C  amLODipine (NORVASC) 10 MG tablet Take 10 mg by mouth daily.    [provider]  lisinopril-hydrochlorothiazide (ZESTORETIC) 20-12.5 MG tablet Take 1 tablet by mouth daily.    [provider]    Family History History reviewed. No pertinent family history.  Social History Social History   Tobacco Use   Smoking status: Every Day    Types: Cigarettes   Smokeless tobacco: Current    Types: Chew  Vaping Use   Vaping Use: Never used     Allergies   Patient has no known allergies.   Review of Systems Review of Systems PER HPI  Physical Exam Triage Vital Signs ED Triage Vitals  Enc Vitals Group     BP 08/28/22 1210 (!) 159/76     Pulse Rate 08/28/22 1210 (!) 53     Resp 08/28/22 1210 16     Temp 08/28/22 1210 97.6 F (36.4 C)     Temp Source 08/28/22 1210 Oral     SpO2 08/28/22  1210 91 %     Weight --      Height --      Head Circumference --      Peak Flow --      Pain Score 08/28/22 1212 0     Pain Loc --      Pain Edu? --      Excl. in [place]? --    No data found.  Updated Vital Signs BP (!) 159/76 (BP Location: Right Arm)   Pulse (!) 53   Temp 97.6 F (36.4 C) (Oral)   Resp 16   SpO2 91%   Visual Acuity Right Eye Distance:   Left Eye Distance:   Bilateral Distance:    Right Eye Near:   Left Eye Near:    Bilateral Near:     Physical Exam Vitals and nursing note reviewed. Exam conducted with a chaperone present.  Constitutional:      Appearance: Normal appearance.  HENT:     Head: Atraumatic.  Eyes:     Extraocular Movements: Extraocular movements intact.     Conjunctiva/sclera: Conjunctivae normal.  Cardiovascular:     Rate and Rhythm: Normal rate and regular rhythm.  Pulmonary:     Effort: Pulmonary effort is normal.     Breath sounds: Normal breath sounds.  Genitourinary:    Comments: Chaperone  present throughout exam.  Diffuse erythema, edema, peeling and areas to head of penis and edge of foreskin.  No rashes or lesions noted, no penile discharge noted. Musculoskeletal:        General: Normal range of motion.     Cervical back: Normal range of motion and neck supple.  Skin:    General: Skin is warm and dry.  Neurological:     General: No focal deficit present.     Mental Status: He is oriented to person, place, and time.  Psychiatric:        Mood and Affect: Mood normal.        Thought Content: Thought content normal.        Judgment: Judgment normal.      UC Treatments / Results  Labs (all labs ordered are listed, but only abnormal results are displayed) Labs Reviewed  POCT URINALYSIS DIP (MANUAL ENTRY) - Abnormal; Notable for the following components:      Result Value   Spec Grav, UA >=1.030 (*)    Protein Ur, POC =100 (*)    All other components within normal limits  CYTOLOGY, (ORAL, ANAL, URETHRAL) ANCILLARY ONLY     EKG   Radiology No results found.  Procedures Procedures (including critical care time)  Medications Ordered in UC Medications - No data to display  Initial Impression / Assessment and Plan / UC Course  I have reviewed the triage vital signs and the nursing notes.  Pertinent labs & imaging results that were available during my care of the patient were reviewed by me and considered in my medical decision making (see chart for details).     Vitals benign and reassuring today, urinalysis without evidence of a urinary tract infection.  Cytology swab pending, treat with mupirocin ointment, warm compresses, close PCP follow-up while awaiting results and adjust if needed based on cytology results.  ED precautions given for worsening symptoms.  Final Clinical Impressions(s) / UC Diagnoses   Final diagnoses:  Penile swelling     Discharge Instructions      Apply the mupirocin ointment twice daily to the entire area of swelling and irritation, apply warm compresses off-and-on additionally.  We should have your swab results back in the next few days and we will let you know if anything comes back positive.  Follow-up with your primary care provider soon as possible to recheck your symptoms and if significantly worsening at any time, particularly being unable to pass urine due to the swelling go to the emergency department.    ED Prescriptions     Medication Sig Dispense Auth. Provider   mupirocin ointment (BACTROBAN) 2 % Apply 1 Application topically 2 (two) times daily. 60 g Volney American, [place]      PDMP not reviewed this encounter.   Volney American, [place] 08/28/22 1304

## 2022-08-31 LAB — CYTOLOGY, (ORAL, ANAL, URETHRAL) ANCILLARY ONLY
Chlamydia: NEGATIVE
Comment: NEGATIVE
Comment: NEGATIVE
Comment: NORMAL
Neisseria Gonorrhea: NEGATIVE
Trichomonas: NEGATIVE

## 2022-10-13 ENCOUNTER — Encounter (INDEPENDENT_AMBULATORY_CARE_PROVIDER_SITE_OTHER): Payer: Medicare Other | Admitting: Ophthalmology

## 2022-10-13 DIAGNOSIS — I10 Essential (primary) hypertension: Secondary | ICD-10-CM | POA: Diagnosis not present

## 2022-10-13 DIAGNOSIS — H35033 Hypertensive retinopathy, bilateral: Secondary | ICD-10-CM

## 2022-10-13 DIAGNOSIS — H43813 Vitreous degeneration, bilateral: Secondary | ICD-10-CM | POA: Diagnosis not present

## 2022-10-13 DIAGNOSIS — H34812 Central retinal vein occlusion, left eye, with macular edema: Secondary | ICD-10-CM | POA: Diagnosis not present

## 2022-11-10 ENCOUNTER — Encounter (INDEPENDENT_AMBULATORY_CARE_PROVIDER_SITE_OTHER): Payer: Medicare Other | Admitting: Ophthalmology

## 2022-11-10 DIAGNOSIS — I10 Essential (primary) hypertension: Secondary | ICD-10-CM

## 2022-11-10 DIAGNOSIS — H2513 Age-related nuclear cataract, bilateral: Secondary | ICD-10-CM

## 2022-11-10 DIAGNOSIS — D3131 Benign neoplasm of right choroid: Secondary | ICD-10-CM | POA: Diagnosis not present

## 2022-11-10 DIAGNOSIS — H35033 Hypertensive retinopathy, bilateral: Secondary | ICD-10-CM | POA: Diagnosis not present

## 2022-11-10 DIAGNOSIS — H34812 Central retinal vein occlusion, left eye, with macular edema: Secondary | ICD-10-CM

## 2022-11-10 DIAGNOSIS — H43813 Vitreous degeneration, bilateral: Secondary | ICD-10-CM

## 2022-12-08 ENCOUNTER — Encounter (INDEPENDENT_AMBULATORY_CARE_PROVIDER_SITE_OTHER): Payer: Medicare Other | Admitting: Ophthalmology

## 2022-12-08 DIAGNOSIS — I10 Essential (primary) hypertension: Secondary | ICD-10-CM | POA: Diagnosis not present

## 2022-12-08 DIAGNOSIS — H35033 Hypertensive retinopathy, bilateral: Secondary | ICD-10-CM | POA: Diagnosis not present

## 2022-12-08 DIAGNOSIS — D3131 Benign neoplasm of right choroid: Secondary | ICD-10-CM | POA: Diagnosis not present

## 2022-12-08 DIAGNOSIS — H34812 Central retinal vein occlusion, left eye, with macular edema: Secondary | ICD-10-CM

## 2022-12-08 DIAGNOSIS — H43813 Vitreous degeneration, bilateral: Secondary | ICD-10-CM

## 2023-01-05 ENCOUNTER — Encounter (INDEPENDENT_AMBULATORY_CARE_PROVIDER_SITE_OTHER): Payer: Medicare Other | Admitting: Ophthalmology

## 2023-01-05 DIAGNOSIS — H34812 Central retinal vein occlusion, left eye, with macular edema: Secondary | ICD-10-CM | POA: Diagnosis not present

## 2023-01-05 DIAGNOSIS — D3131 Benign neoplasm of right choroid: Secondary | ICD-10-CM

## 2023-01-05 DIAGNOSIS — I10 Essential (primary) hypertension: Secondary | ICD-10-CM

## 2023-01-05 DIAGNOSIS — H35033 Hypertensive retinopathy, bilateral: Secondary | ICD-10-CM

## 2023-01-05 DIAGNOSIS — H43813 Vitreous degeneration, bilateral: Secondary | ICD-10-CM

## 2023-02-02 ENCOUNTER — Encounter (INDEPENDENT_AMBULATORY_CARE_PROVIDER_SITE_OTHER): Payer: Medicare Other | Admitting: Ophthalmology

## 2023-02-02 DIAGNOSIS — D3131 Benign neoplasm of right choroid: Secondary | ICD-10-CM | POA: Diagnosis not present

## 2023-02-02 DIAGNOSIS — I10 Essential (primary) hypertension: Secondary | ICD-10-CM | POA: Diagnosis not present

## 2023-02-02 DIAGNOSIS — H43813 Vitreous degeneration, bilateral: Secondary | ICD-10-CM | POA: Diagnosis not present

## 2023-02-02 DIAGNOSIS — H34812 Central retinal vein occlusion, left eye, with macular edema: Secondary | ICD-10-CM | POA: Diagnosis not present

## 2023-02-02 DIAGNOSIS — H35033 Hypertensive retinopathy, bilateral: Secondary | ICD-10-CM | POA: Diagnosis not present

## 2023-02-04 DIAGNOSIS — N184 Chronic kidney disease, stage 4 (severe): Secondary | ICD-10-CM | POA: Diagnosis not present

## 2023-02-04 DIAGNOSIS — K5090 Crohn's disease, unspecified, without complications: Secondary | ICD-10-CM | POA: Diagnosis not present

## 2023-02-04 DIAGNOSIS — Z23 Encounter for immunization: Secondary | ICD-10-CM | POA: Diagnosis not present

## 2023-02-04 DIAGNOSIS — I10 Essential (primary) hypertension: Secondary | ICD-10-CM | POA: Diagnosis not present

## 2023-03-05 ENCOUNTER — Encounter (INDEPENDENT_AMBULATORY_CARE_PROVIDER_SITE_OTHER): Payer: Medicare Other | Admitting: Ophthalmology

## 2023-03-05 DIAGNOSIS — I10 Essential (primary) hypertension: Secondary | ICD-10-CM

## 2023-03-05 DIAGNOSIS — H35033 Hypertensive retinopathy, bilateral: Secondary | ICD-10-CM

## 2023-03-05 DIAGNOSIS — H43813 Vitreous degeneration, bilateral: Secondary | ICD-10-CM

## 2023-03-05 DIAGNOSIS — D3131 Benign neoplasm of right choroid: Secondary | ICD-10-CM

## 2023-03-05 DIAGNOSIS — H34812 Central retinal vein occlusion, left eye, with macular edema: Secondary | ICD-10-CM | POA: Diagnosis not present

## 2023-03-06 DIAGNOSIS — N184 Chronic kidney disease, stage 4 (severe): Secondary | ICD-10-CM | POA: Diagnosis not present

## 2023-03-06 DIAGNOSIS — I10 Essential (primary) hypertension: Secondary | ICD-10-CM | POA: Diagnosis not present

## 2023-04-02 ENCOUNTER — Encounter (INDEPENDENT_AMBULATORY_CARE_PROVIDER_SITE_OTHER): Payer: Medicare Other | Admitting: Ophthalmology

## 2023-04-02 DIAGNOSIS — H43813 Vitreous degeneration, bilateral: Secondary | ICD-10-CM

## 2023-04-02 DIAGNOSIS — H34812 Central retinal vein occlusion, left eye, with macular edema: Secondary | ICD-10-CM

## 2023-04-02 DIAGNOSIS — H35033 Hypertensive retinopathy, bilateral: Secondary | ICD-10-CM

## 2023-04-02 DIAGNOSIS — D3131 Benign neoplasm of right choroid: Secondary | ICD-10-CM | POA: Diagnosis not present

## 2023-04-02 DIAGNOSIS — I10 Essential (primary) hypertension: Secondary | ICD-10-CM

## 2023-04-02 DIAGNOSIS — H2513 Age-related nuclear cataract, bilateral: Secondary | ICD-10-CM

## 2023-04-06 DIAGNOSIS — N184 Chronic kidney disease, stage 4 (severe): Secondary | ICD-10-CM | POA: Diagnosis not present

## 2023-04-06 DIAGNOSIS — I10 Essential (primary) hypertension: Secondary | ICD-10-CM | POA: Diagnosis not present

## 2023-05-06 DIAGNOSIS — N184 Chronic kidney disease, stage 4 (severe): Secondary | ICD-10-CM | POA: Diagnosis not present

## 2023-05-06 DIAGNOSIS — I10 Essential (primary) hypertension: Secondary | ICD-10-CM | POA: Diagnosis not present

## 2023-05-07 ENCOUNTER — Encounter (INDEPENDENT_AMBULATORY_CARE_PROVIDER_SITE_OTHER): Payer: Medicare Other | Admitting: Ophthalmology

## 2023-05-11 ENCOUNTER — Encounter (INDEPENDENT_AMBULATORY_CARE_PROVIDER_SITE_OTHER): Payer: Medicare Other | Admitting: Ophthalmology

## 2023-05-11 DIAGNOSIS — H35033 Hypertensive retinopathy, bilateral: Secondary | ICD-10-CM | POA: Diagnosis not present

## 2023-05-11 DIAGNOSIS — H34812 Central retinal vein occlusion, left eye, with macular edema: Secondary | ICD-10-CM

## 2023-05-11 DIAGNOSIS — I10 Essential (primary) hypertension: Secondary | ICD-10-CM | POA: Diagnosis not present

## 2023-05-11 DIAGNOSIS — H43813 Vitreous degeneration, bilateral: Secondary | ICD-10-CM

## 2023-05-11 DIAGNOSIS — H2513 Age-related nuclear cataract, bilateral: Secondary | ICD-10-CM | POA: Diagnosis not present

## 2023-06-06 DIAGNOSIS — N184 Chronic kidney disease, stage 4 (severe): Secondary | ICD-10-CM | POA: Diagnosis not present

## 2023-06-06 DIAGNOSIS — I10 Essential (primary) hypertension: Secondary | ICD-10-CM | POA: Diagnosis not present

## 2023-06-14 ENCOUNTER — Encounter (INDEPENDENT_AMBULATORY_CARE_PROVIDER_SITE_OTHER): Payer: Medicare Other | Admitting: Ophthalmology

## 2023-06-14 DIAGNOSIS — H43813 Vitreous degeneration, bilateral: Secondary | ICD-10-CM | POA: Diagnosis not present

## 2023-06-14 DIAGNOSIS — H34812 Central retinal vein occlusion, left eye, with macular edema: Secondary | ICD-10-CM

## 2023-06-14 DIAGNOSIS — H35033 Hypertensive retinopathy, bilateral: Secondary | ICD-10-CM

## 2023-06-14 DIAGNOSIS — D3131 Benign neoplasm of right choroid: Secondary | ICD-10-CM | POA: Diagnosis not present

## 2023-06-14 DIAGNOSIS — I10 Essential (primary) hypertension: Secondary | ICD-10-CM | POA: Diagnosis not present

## 2023-07-19 ENCOUNTER — Encounter (INDEPENDENT_AMBULATORY_CARE_PROVIDER_SITE_OTHER): Payer: Medicare Other | Admitting: Ophthalmology

## 2023-07-19 DIAGNOSIS — H35033 Hypertensive retinopathy, bilateral: Secondary | ICD-10-CM

## 2023-07-19 DIAGNOSIS — H43813 Vitreous degeneration, bilateral: Secondary | ICD-10-CM

## 2023-07-19 DIAGNOSIS — H34831 Tributary (branch) retinal vein occlusion, right eye, with macular edema: Secondary | ICD-10-CM | POA: Diagnosis not present

## 2023-07-19 DIAGNOSIS — H34812 Central retinal vein occlusion, left eye, with macular edema: Secondary | ICD-10-CM

## 2023-07-19 DIAGNOSIS — D3131 Benign neoplasm of right choroid: Secondary | ICD-10-CM | POA: Diagnosis not present

## 2023-07-19 DIAGNOSIS — I10 Essential (primary) hypertension: Secondary | ICD-10-CM | POA: Diagnosis not present

## 2023-08-26 ENCOUNTER — Encounter (INDEPENDENT_AMBULATORY_CARE_PROVIDER_SITE_OTHER): Payer: Medicare Other | Admitting: Ophthalmology

## 2023-08-26 DIAGNOSIS — H348312 Tributary (branch) retinal vein occlusion, right eye, stable: Secondary | ICD-10-CM | POA: Diagnosis not present

## 2023-08-26 DIAGNOSIS — H34812 Central retinal vein occlusion, left eye, with macular edema: Secondary | ICD-10-CM | POA: Diagnosis not present

## 2023-08-26 DIAGNOSIS — D3131 Benign neoplasm of right choroid: Secondary | ICD-10-CM

## 2023-08-26 DIAGNOSIS — H43813 Vitreous degeneration, bilateral: Secondary | ICD-10-CM | POA: Diagnosis not present

## 2023-08-26 DIAGNOSIS — H35033 Hypertensive retinopathy, bilateral: Secondary | ICD-10-CM

## 2023-08-26 DIAGNOSIS — I10 Essential (primary) hypertension: Secondary | ICD-10-CM | POA: Diagnosis not present

## 2023-08-27 DIAGNOSIS — Z0001 Encounter for general adult medical examination with abnormal findings: Secondary | ICD-10-CM | POA: Diagnosis not present

## 2023-08-27 DIAGNOSIS — N184 Chronic kidney disease, stage 4 (severe): Secondary | ICD-10-CM | POA: Diagnosis not present

## 2023-08-27 DIAGNOSIS — F17210 Nicotine dependence, cigarettes, uncomplicated: Secondary | ICD-10-CM | POA: Diagnosis not present

## 2023-08-27 DIAGNOSIS — H541223 Low vision right eye category 2, blindness left eye category 3: Secondary | ICD-10-CM | POA: Diagnosis not present

## 2023-08-27 DIAGNOSIS — F17290 Nicotine dependence, other tobacco product, uncomplicated: Secondary | ICD-10-CM | POA: Diagnosis not present

## 2023-08-27 DIAGNOSIS — I10 Essential (primary) hypertension: Secondary | ICD-10-CM | POA: Diagnosis not present

## 2023-08-27 DIAGNOSIS — K5090 Crohn's disease, unspecified, without complications: Secondary | ICD-10-CM | POA: Diagnosis not present

## 2023-08-27 DIAGNOSIS — Z1389 Encounter for screening for other disorder: Secondary | ICD-10-CM | POA: Diagnosis not present

## 2023-09-27 DIAGNOSIS — I10 Essential (primary) hypertension: Secondary | ICD-10-CM | POA: Diagnosis not present

## 2023-09-27 DIAGNOSIS — N184 Chronic kidney disease, stage 4 (severe): Secondary | ICD-10-CM | POA: Diagnosis not present

## 2023-09-30 ENCOUNTER — Encounter (INDEPENDENT_AMBULATORY_CARE_PROVIDER_SITE_OTHER): Admitting: Ophthalmology

## 2023-09-30 DIAGNOSIS — H34812 Central retinal vein occlusion, left eye, with macular edema: Secondary | ICD-10-CM

## 2023-09-30 DIAGNOSIS — H35033 Hypertensive retinopathy, bilateral: Secondary | ICD-10-CM

## 2023-09-30 DIAGNOSIS — I10 Essential (primary) hypertension: Secondary | ICD-10-CM

## 2023-09-30 DIAGNOSIS — H348312 Tributary (branch) retinal vein occlusion, right eye, stable: Secondary | ICD-10-CM | POA: Diagnosis not present

## 2023-09-30 DIAGNOSIS — H43813 Vitreous degeneration, bilateral: Secondary | ICD-10-CM | POA: Diagnosis not present

## 2023-09-30 DIAGNOSIS — H353111 Nonexudative age-related macular degeneration, right eye, early dry stage: Secondary | ICD-10-CM

## 2023-09-30 DIAGNOSIS — D3131 Benign neoplasm of right choroid: Secondary | ICD-10-CM

## 2023-10-27 DIAGNOSIS — N184 Chronic kidney disease, stage 4 (severe): Secondary | ICD-10-CM | POA: Diagnosis not present

## 2023-10-27 DIAGNOSIS — I10 Essential (primary) hypertension: Secondary | ICD-10-CM | POA: Diagnosis not present

## 2023-11-04 ENCOUNTER — Encounter (INDEPENDENT_AMBULATORY_CARE_PROVIDER_SITE_OTHER): Admitting: Ophthalmology

## 2023-11-04 DIAGNOSIS — H348312 Tributary (branch) retinal vein occlusion, right eye, stable: Secondary | ICD-10-CM

## 2023-11-04 DIAGNOSIS — H43813 Vitreous degeneration, bilateral: Secondary | ICD-10-CM | POA: Diagnosis not present

## 2023-11-04 DIAGNOSIS — I10 Essential (primary) hypertension: Secondary | ICD-10-CM | POA: Diagnosis not present

## 2023-11-04 DIAGNOSIS — H35033 Hypertensive retinopathy, bilateral: Secondary | ICD-10-CM | POA: Diagnosis not present

## 2023-11-04 DIAGNOSIS — D3131 Benign neoplasm of right choroid: Secondary | ICD-10-CM

## 2023-11-04 DIAGNOSIS — H34812 Central retinal vein occlusion, left eye, with macular edema: Secondary | ICD-10-CM | POA: Diagnosis not present

## 2023-11-27 DIAGNOSIS — I10 Essential (primary) hypertension: Secondary | ICD-10-CM | POA: Diagnosis not present

## 2023-11-27 DIAGNOSIS — N184 Chronic kidney disease, stage 4 (severe): Secondary | ICD-10-CM | POA: Diagnosis not present

## 2023-12-09 ENCOUNTER — Encounter (INDEPENDENT_AMBULATORY_CARE_PROVIDER_SITE_OTHER): Admitting: Ophthalmology

## 2023-12-09 DIAGNOSIS — H348312 Tributary (branch) retinal vein occlusion, right eye, stable: Secondary | ICD-10-CM | POA: Diagnosis not present

## 2023-12-09 DIAGNOSIS — H34812 Central retinal vein occlusion, left eye, with macular edema: Secondary | ICD-10-CM

## 2023-12-09 DIAGNOSIS — I10 Essential (primary) hypertension: Secondary | ICD-10-CM | POA: Diagnosis not present

## 2023-12-09 DIAGNOSIS — H35033 Hypertensive retinopathy, bilateral: Secondary | ICD-10-CM

## 2023-12-09 DIAGNOSIS — H43813 Vitreous degeneration, bilateral: Secondary | ICD-10-CM | POA: Diagnosis not present

## 2023-12-27 DIAGNOSIS — N184 Chronic kidney disease, stage 4 (severe): Secondary | ICD-10-CM | POA: Diagnosis not present

## 2023-12-27 DIAGNOSIS — I10 Essential (primary) hypertension: Secondary | ICD-10-CM | POA: Diagnosis not present

## 2024-01-13 ENCOUNTER — Encounter (INDEPENDENT_AMBULATORY_CARE_PROVIDER_SITE_OTHER): Admitting: Ophthalmology

## 2024-01-13 DIAGNOSIS — H348312 Tributary (branch) retinal vein occlusion, right eye, stable: Secondary | ICD-10-CM

## 2024-01-13 DIAGNOSIS — I10 Essential (primary) hypertension: Secondary | ICD-10-CM | POA: Diagnosis not present

## 2024-01-13 DIAGNOSIS — H34812 Central retinal vein occlusion, left eye, with macular edema: Secondary | ICD-10-CM

## 2024-01-13 DIAGNOSIS — H43813 Vitreous degeneration, bilateral: Secondary | ICD-10-CM

## 2024-01-13 DIAGNOSIS — H35033 Hypertensive retinopathy, bilateral: Secondary | ICD-10-CM

## 2024-01-13 DIAGNOSIS — D3131 Benign neoplasm of right choroid: Secondary | ICD-10-CM

## 2024-01-27 DIAGNOSIS — I10 Essential (primary) hypertension: Secondary | ICD-10-CM | POA: Diagnosis not present

## 2024-01-27 DIAGNOSIS — N184 Chronic kidney disease, stage 4 (severe): Secondary | ICD-10-CM | POA: Diagnosis not present

## 2024-02-15 ENCOUNTER — Encounter (INDEPENDENT_AMBULATORY_CARE_PROVIDER_SITE_OTHER): Admitting: Ophthalmology

## 2024-02-15 DIAGNOSIS — H34812 Central retinal vein occlusion, left eye, with macular edema: Secondary | ICD-10-CM

## 2024-02-15 DIAGNOSIS — D3131 Benign neoplasm of right choroid: Secondary | ICD-10-CM

## 2024-02-15 DIAGNOSIS — H43813 Vitreous degeneration, bilateral: Secondary | ICD-10-CM

## 2024-02-15 DIAGNOSIS — H348312 Tributary (branch) retinal vein occlusion, right eye, stable: Secondary | ICD-10-CM | POA: Diagnosis not present

## 2024-02-15 DIAGNOSIS — H35033 Hypertensive retinopathy, bilateral: Secondary | ICD-10-CM

## 2024-02-15 DIAGNOSIS — I10 Essential (primary) hypertension: Secondary | ICD-10-CM | POA: Diagnosis not present

## 2024-02-18 DIAGNOSIS — N184 Chronic kidney disease, stage 4 (severe): Secondary | ICD-10-CM | POA: Diagnosis not present

## 2024-02-18 DIAGNOSIS — I10 Essential (primary) hypertension: Secondary | ICD-10-CM | POA: Diagnosis not present

## 2024-02-18 DIAGNOSIS — Z681 Body mass index (BMI) 19 or less, adult: Secondary | ICD-10-CM | POA: Diagnosis not present

## 2024-02-18 DIAGNOSIS — Z8679 Personal history of other diseases of the circulatory system: Secondary | ICD-10-CM | POA: Diagnosis not present

## 2024-02-23 DIAGNOSIS — Z23 Encounter for immunization: Secondary | ICD-10-CM | POA: Diagnosis not present

## 2024-03-19 DIAGNOSIS — N184 Chronic kidney disease, stage 4 (severe): Secondary | ICD-10-CM | POA: Diagnosis not present

## 2024-03-19 DIAGNOSIS — I10 Essential (primary) hypertension: Secondary | ICD-10-CM | POA: Diagnosis not present

## 2024-03-21 ENCOUNTER — Encounter (INDEPENDENT_AMBULATORY_CARE_PROVIDER_SITE_OTHER): Admitting: Ophthalmology

## 2024-03-21 DIAGNOSIS — H43813 Vitreous degeneration, bilateral: Secondary | ICD-10-CM | POA: Diagnosis not present

## 2024-03-21 DIAGNOSIS — D3131 Benign neoplasm of right choroid: Secondary | ICD-10-CM

## 2024-03-21 DIAGNOSIS — H35033 Hypertensive retinopathy, bilateral: Secondary | ICD-10-CM

## 2024-03-21 DIAGNOSIS — I10 Essential (primary) hypertension: Secondary | ICD-10-CM

## 2024-03-21 DIAGNOSIS — H34812 Central retinal vein occlusion, left eye, with macular edema: Secondary | ICD-10-CM

## 2024-04-24 ENCOUNTER — Encounter (INDEPENDENT_AMBULATORY_CARE_PROVIDER_SITE_OTHER): Admitting: Ophthalmology

## 2024-04-24 DIAGNOSIS — H34812 Central retinal vein occlusion, left eye, with macular edema: Secondary | ICD-10-CM | POA: Diagnosis not present

## 2024-04-24 DIAGNOSIS — H43813 Vitreous degeneration, bilateral: Secondary | ICD-10-CM

## 2024-04-24 DIAGNOSIS — H348312 Tributary (branch) retinal vein occlusion, right eye, stable: Secondary | ICD-10-CM | POA: Diagnosis not present

## 2024-04-24 DIAGNOSIS — H35033 Hypertensive retinopathy, bilateral: Secondary | ICD-10-CM

## 2024-04-24 DIAGNOSIS — H2513 Age-related nuclear cataract, bilateral: Secondary | ICD-10-CM

## 2024-04-24 DIAGNOSIS — I10 Essential (primary) hypertension: Secondary | ICD-10-CM

## 2024-06-06 ENCOUNTER — Encounter (INDEPENDENT_AMBULATORY_CARE_PROVIDER_SITE_OTHER): Admitting: Ophthalmology

## 2024-06-06 DIAGNOSIS — H35033 Hypertensive retinopathy, bilateral: Secondary | ICD-10-CM

## 2024-06-06 DIAGNOSIS — H34812 Central retinal vein occlusion, left eye, with macular edema: Secondary | ICD-10-CM | POA: Diagnosis not present

## 2024-06-06 DIAGNOSIS — H2513 Age-related nuclear cataract, bilateral: Secondary | ICD-10-CM | POA: Diagnosis not present

## 2024-06-06 DIAGNOSIS — I10 Essential (primary) hypertension: Secondary | ICD-10-CM | POA: Diagnosis not present

## 2024-06-06 DIAGNOSIS — H43813 Vitreous degeneration, bilateral: Secondary | ICD-10-CM | POA: Diagnosis not present

## 2024-06-06 DIAGNOSIS — H348312 Tributary (branch) retinal vein occlusion, right eye, stable: Secondary | ICD-10-CM | POA: Diagnosis not present

## 2024-06-06 DIAGNOSIS — D3131 Benign neoplasm of right choroid: Secondary | ICD-10-CM | POA: Diagnosis not present

## 2024-07-06 ENCOUNTER — Encounter (INDEPENDENT_AMBULATORY_CARE_PROVIDER_SITE_OTHER): Admitting: Ophthalmology

## 2024-07-11 ENCOUNTER — Encounter (INDEPENDENT_AMBULATORY_CARE_PROVIDER_SITE_OTHER): Admitting: Ophthalmology
